# Patient Record
Sex: Female | Born: 1960 | Hispanic: No | Marital: Married | Smoking: Former smoker
Health system: Southern US, Community
[De-identification: ages and names within clinical notes are randomized; demographics above are authoritative.]

## PROBLEM LIST (undated history)

## (undated) DIAGNOSIS — F0390 Unspecified dementia without behavioral disturbance: Secondary | ICD-10-CM

## (undated) DIAGNOSIS — R569 Unspecified convulsions: Secondary | ICD-10-CM

## (undated) DIAGNOSIS — E0590 Thyrotoxicosis, unspecified without thyrotoxic crisis or storm: Secondary | ICD-10-CM

## (undated) DIAGNOSIS — R51 Headache: Secondary | ICD-10-CM

## (undated) DIAGNOSIS — S22000A Wedge compression fracture of unspecified thoracic vertebra, initial encounter for closed fracture: Secondary | ICD-10-CM

## (undated) DIAGNOSIS — R413 Other amnesia: Secondary | ICD-10-CM

## (undated) HISTORY — DX: Other amnesia: R41.3

## (undated) HISTORY — PX: OTHER SURGICAL HISTORY: SHX169

---

## 2008-07-06 ENCOUNTER — Other Ambulatory Visit: Admission: RE | Admit: 2008-07-06 | Discharge: 2008-07-06 | Payer: Self-pay | Admitting: Gynecology

## 2012-02-11 ENCOUNTER — Other Ambulatory Visit: Payer: Self-pay | Admitting: Family Medicine

## 2012-02-11 DIAGNOSIS — R413 Other amnesia: Secondary | ICD-10-CM

## 2012-02-13 ENCOUNTER — Ambulatory Visit
Admission: RE | Admit: 2012-02-13 | Discharge: 2012-02-13 | Disposition: A | Discharge status: Home / Self Care | Payer: BC Managed Care – PPO | Source: Ambulatory Visit | Attending: Family Medicine | Admitting: Family Medicine

## 2012-02-13 DIAGNOSIS — R413 Other amnesia: Secondary | ICD-10-CM

## 2012-03-10 ENCOUNTER — Other Ambulatory Visit: Payer: Self-pay | Admitting: Family Medicine

## 2012-03-10 ENCOUNTER — Other Ambulatory Visit (HOSPITAL_COMMUNITY)
Admission: RE | Admit: 2012-03-10 | Discharge: 2012-03-10 | Disposition: A | Discharge status: Home / Self Care | Payer: BC Managed Care – PPO | Source: Ambulatory Visit | Attending: Family Medicine | Admitting: Family Medicine

## 2012-03-10 DIAGNOSIS — Z1231 Encounter for screening mammogram for malignant neoplasm of breast: Secondary | ICD-10-CM

## 2012-03-10 DIAGNOSIS — Z01419 Encounter for gynecological examination (general) (routine) without abnormal findings: Secondary | ICD-10-CM | POA: Insufficient documentation

## 2012-03-15 ENCOUNTER — Ambulatory Visit: Payer: BC Managed Care – PPO

## 2012-04-16 ENCOUNTER — Ambulatory Visit: Payer: BC Managed Care – PPO

## 2013-03-17 ENCOUNTER — Ambulatory Visit (INDEPENDENT_AMBULATORY_CARE_PROVIDER_SITE_OTHER): Payer: BC Managed Care – PPO | Admitting: Neurology

## 2013-03-17 ENCOUNTER — Encounter: Payer: Self-pay | Admitting: Neurology

## 2013-03-17 VITALS — BP 115/76 | HR 68 | Ht 69.0 in | Wt 146.0 lb

## 2013-03-17 DIAGNOSIS — R413 Other amnesia: Secondary | ICD-10-CM

## 2013-03-17 NOTE — Patient Instructions (Addendum)
Lab evaluation today MRI brain

## 2013-03-17 NOTE — Progress Notes (Signed)
51 years old right-handed Caucasian female, accompanied by her husband, referred by her primary care physician Dr. Blair Heys for evaluation of memory loss.  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 CAT scan without contrast February 2013 reviewed, there was marked generalized atrophy, more prominent at bilateral frontal temporal,  Laboratory evaluation normal TSH, CBC, CMP, vitamin B12,  Review of system: She complains of fatigue,  PHYSICAL EXAMINATOINS:  Generalized: In no acute distress  Neck: Supple, no carotid bruits  Cardiac: Regular rate rhythm  Pulmonary: Clear to auscultation bilaterally  Musculoskeletal: No deformity  Neurological examination  Mentation: Tearful, frustrated, has difficulty following two-step commands, Mini-Mental Status Examination 12/30,  comprehensive difficulty, mild naming difficulty, normal repeating, she is not oriented to time, place, has difficulty doing serial 7, missed 2 out of 3 recalls, could not copy design, or  write a full sentence.  Cranial II-Xii: Pupils equal round reactive to light, extraoular movements were full, visual field were full on confrontational test. facial sensation and strength were normal. hearing was  intact to finger rubbing bilaterally. Uvula tongue midline.  head turning and shoulder shrug and were normal and symmetric.Tongue protrusion into cheek strength was normal.  Motor: normal tone, bulk and strength.  Sensory: Intact to fine touch, pinprick, preserved vibratory sensation, and proprioception at toes.  Coordination: Normal finger to nose, heel-to-shin bilaterally there was no truncal ataxia  Gait: Rising up from seated position without assistance, normal stance, without trunk ataxia, moderate stride, good arm swing, smooth turning, able to perform tiptoe, and heel walking without difficulty.   Romberg signs: Negative  Deep tendon reflexes: Brachioradialis 2/2, biceps 2/2, triceps 2/2, patellar 2/2, Achilles 2/2, plantar responses were flexor bilaterally.  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 other  2 complete evaluation with MRI of brain. 3 laboratory evaluation  4 return to clinic in 2-3 months.Marland Kitchen

## 2013-03-18 LAB — SEDIMENTATION RATE: Sed Rate: 2 mm/hr (ref 0–40)

## 2013-03-18 LAB — ANGIOTENSIN CONVERTING ENZYME: Angio Convert Enzyme: 19 U/L (ref 14–82)

## 2013-03-18 LAB — TSH: TSH: 1.11 u[IU]/mL (ref 0.450–4.500)

## 2013-03-18 LAB — HIV ANTIBODY (ROUTINE TESTING W REFLEX): HIV-1/HIV-2 Ab: NONREACTIVE

## 2013-03-18 LAB — T4, FREE: Free T4: 1.65 ng/dL (ref 0.82–1.77)

## 2013-03-18 LAB — ANA: Anti Nuclear Antibody(ANA): NEGATIVE

## 2013-03-18 LAB — RPR: RPR: NONREACTIVE

## 2013-03-18 LAB — FOLATE: Folate: 18.1 ng/mL (ref >3.0)

## 2013-03-22 ENCOUNTER — Telehealth: Payer: Self-pay | Admitting: Neurology

## 2013-03-22 NOTE — Telephone Encounter (Signed)
Cheyenne Tyler, please call patient for normal laboratory result

## 2013-03-23 ENCOUNTER — Other Ambulatory Visit: Payer: BC Managed Care – PPO

## 2013-03-23 NOTE — Telephone Encounter (Signed)
Husband called for lab results.  I spoke to husband and gave normal results, per Dr. Terrace Arabia.  He wasn't pleased that results were normal, he wants to know what the patient's problem is, and would like some answers.  He said she had an MRI on Wednesday,  I told him to wait for the results from MRI, before speaking to doctor.  He agreed.

## 2013-03-31 ENCOUNTER — Ambulatory Visit (INDEPENDENT_AMBULATORY_CARE_PROVIDER_SITE_OTHER): Payer: BC Managed Care – PPO

## 2013-03-31 DIAGNOSIS — R413 Other amnesia: Secondary | ICD-10-CM

## 2013-04-01 NOTE — Progress Notes (Signed)
Quick Note:  I have called her husband, abnormal lab showed thyroidglobulin ab, thyroid peroxidase antibody,  MRi showed atrophy, I advise her to come in April 7th 230pm. ______

## 2013-04-01 NOTE — Progress Notes (Signed)
Quick Note:  I have called her MRI brain findings atrophy, will review film on next follow up visit. ______

## 2013-04-04 ENCOUNTER — Encounter: Payer: Self-pay | Admitting: Neurology

## 2013-04-04 ENCOUNTER — Ambulatory Visit (INDEPENDENT_AMBULATORY_CARE_PROVIDER_SITE_OTHER): Payer: BC Managed Care – PPO | Admitting: Neurology

## 2013-04-04 VITALS — BP 110/75 | HR 68 | Wt 147.0 lb

## 2013-04-04 DIAGNOSIS — R413 Other amnesia: Secondary | ICD-10-CM

## 2013-04-04 MED ORDER — BUTALBITAL-APAP-CAFFEINE 50-325-40 MG PO TABS: 1.0000 | ORAL_TABLET | Freq: Four times a day (QID) | ORAL | Status: DC | PRN

## 2013-04-04 NOTE — Progress Notes (Signed)
52 years old right-handed Caucasian female, accompanied by her husband, referred by her primary care physician Dr. Blair Heys for evaluation of memory loss.  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  CAT scan without contrast February 2013 reviewed, there was marked generalized atrophy, more prominent at bilateral frontal temporal,  Laboratory evaluation normal TSH, CBC, CMP, vitamin B12,  Updated April 7th 2014:  She is with her husband today, laboratory tests revealed, there is evidence of markedly elevated thyroid globulin antibody, thyroid peroxidase antibody, otherwise negative HIV, RPR, B12, CMP, CBC,    MRI of the brain showed moderate to severe generalized atrophy,  PHYSICAL EXAMINATOINS:  Generalized: In no acute distress  Neck: Supple, no carotid bruits  Cardiac: Regular rate rhythm  Pulmonary: Clear to auscultation bilaterally  Musculoskeletal: No deformity  Neurological examination  Mentation: frustrated, has difficulty following two-step commands, difficult to perform Mini-Mental Status Examination,   Cranial II-Xii: Pupils equal round reactive to light, extraoular movements were full, visual field were full on confrontational test. facial sensation  and strength were normal. hearing was intact to finger rubbing bilaterally. Uvula tongue midline.  head turning and shoulder shrug and were normal and symmetric.Tongue protrusion into cheek strength was normal.  Motor: normal tone, bulk and strength.  Sensory: Intact to fine touch, pinprick, preserved vibratory sensation, and proprioception at toes.  Coordination: Normal finger to nose, heel-to-shin bilaterally there was no truncal ataxia  Gait: Rising up from seated position without assistance, normal stance, without trunk ataxia, moderate stride, good arm swing, smooth turning, able to perform tiptoe, and heel walking without difficulty.   Romberg signs: Negative  Deep tendon reflexes: Brachioradialis 2/2, biceps 2/2, triceps 2/2, patellar 2/2, Achilles 2/2, plantar responses were flexor bilaterally.  Assessment and plan: 52 years old Caucasian female, presenting with 5 years history of rapid onset memory trouble, mainly at short-term memory, also visuospatial disorientation,comprehensive difficulty,   1 MRI of the brain showed generalized atrophy. Indicating a slow process, 2. However the elevated thyroglobulin antibody, thyroid peroxidase antibody could be indicating a steroid responsive immunoencephalopathy such as Hashimoto's encephalopathy  3. EEG. 4. Repeat Labs,  5. Neuropsychiatric evaluation 6. Lumbar puncture.  7. Return to clinic in one month

## 2013-04-05 NOTE — Telephone Encounter (Signed)
Reviewed result during her follow up visit.

## 2013-04-06 LAB — HEAVY METALS SCREEN, URINE

## 2013-04-06 LAB — SJOGRENS SYNDROME-B EXTRACTABLE NUCLEAR ANTIBODY: ENA SSB (LA) Ab: <0.2 AI (ref 0.0–0.9)

## 2013-04-06 LAB — RHEUMATOID FACTOR: Rhuematoid fact SerPl-aCnc: 11.7 IU/mL (ref 0.0–13.9)

## 2013-04-06 LAB — THYROID PEROXIDASE ANTIBODY: Thyroid Peroxidase Ab: 548 IU/mL — ABNORMAL HIGH (ref 0–34)

## 2013-04-06 LAB — COPPER, SERUM: Copper: 83 ug/dL (ref 72–166)

## 2013-04-06 LAB — SJOGRENS SYNDROME-A EXTRACTABLE NUCLEAR ANTIBODY: ENA SSA (RO) Ab: <0.2 AI (ref 0.0–0.9)

## 2013-04-06 LAB — HEPATITIS PANEL, ACUTE: Hep A IgM: NEGATIVE

## 2013-04-06 LAB — THYROGLOBULIN ANTIBODY: Thyroglobulin Ab: 1.9 IU/mL — ABNORMAL HIGH (ref 0.0–0.9)

## 2013-04-06 NOTE — Progress Notes (Signed)
Quick Note:  Reviewed, will go over result with patient on follow up visit. ______

## 2013-04-07 ENCOUNTER — Ambulatory Visit (INDEPENDENT_AMBULATORY_CARE_PROVIDER_SITE_OTHER): Payer: BC Managed Care – PPO

## 2013-04-07 DIAGNOSIS — R413 Other amnesia: Secondary | ICD-10-CM

## 2013-04-11 ENCOUNTER — Other Ambulatory Visit: Payer: Self-pay | Admitting: Neurology

## 2013-04-11 DIAGNOSIS — R413 Other amnesia: Secondary | ICD-10-CM

## 2013-04-18 ENCOUNTER — Encounter: Payer: Self-pay | Admitting: Neurology

## 2013-04-18 NOTE — Procedures (Signed)
HISTORY: 52 years old female, with memory loss, progressively worsening  TECHNIQUE:  16 channel EEG was performed based on standard 10-16 international system. One channel was dedicated to EKG, which has demonstrates normal sinus rhythm of 72 beats per minutes.  Upon awakening, the posterior background activity was rhythmic with frequent small amplitude beta range activities, reactive to eye opening and closure. There was frequent muscle artifact, also intermittent posterior higher amplitude, theta range activity  There was no evidence of epilepsy for discharge.  Photic stimulation was performed, which induced a symmetric photic driving.  Hyperventilation was performed, there was no abnormality elicit.  No sleep was achieved.  CONCLUSION: This is an abnormal EEG.  There is evidence of increased beta activity, commonly seen in patient taking benzodiazepine, there is also evidence of intermittent theta activity, indicating mild bihemisphere dysfunction, commonly associated with metabolic toxic etiology

## 2013-04-20 ENCOUNTER — Ambulatory Visit
Admission: RE | Admit: 2013-04-20 | Discharge: 2013-04-20 | Disposition: A | Discharge status: Home / Self Care | Payer: BC Managed Care – PPO | Source: Ambulatory Visit | Attending: Neurology | Admitting: Neurology

## 2013-04-20 VITALS — BP 133/79 | HR 67

## 2013-04-20 DIAGNOSIS — R413 Other amnesia: Secondary | ICD-10-CM

## 2013-04-20 LAB — GRAM STAIN: Gram Stain: NONE SEEN

## 2013-04-22 LAB — CSF PANEL 1
Glucose, CSF: 60 mg/dL (ref 43–76)
Tube #: 4

## 2013-04-28 ENCOUNTER — Telehealth: Payer: Self-pay | Admitting: Neurology

## 2013-04-28 ENCOUNTER — Observation Stay (HOSPITAL_COMMUNITY)
Admission: EM | Admit: 2013-04-28 | Discharge: 2013-04-30 | Disposition: A | Discharge status: Home / Self Care | Payer: BC Managed Care – PPO | Attending: Internal Medicine | Admitting: Internal Medicine

## 2013-04-28 ENCOUNTER — Emergency Department (HOSPITAL_COMMUNITY): Payer: BC Managed Care – PPO

## 2013-04-28 ENCOUNTER — Encounter (HOSPITAL_COMMUNITY): Payer: Self-pay | Admitting: Emergency Medicine

## 2013-04-28 ENCOUNTER — Observation Stay (HOSPITAL_COMMUNITY): Payer: BC Managed Care – PPO

## 2013-04-28 DIAGNOSIS — E049 Nontoxic goiter, unspecified: Secondary | ICD-10-CM

## 2013-04-28 DIAGNOSIS — R404 Transient alteration of awareness: Secondary | ICD-10-CM | POA: Insufficient documentation

## 2013-04-28 DIAGNOSIS — R112 Nausea with vomiting, unspecified: Secondary | ICD-10-CM | POA: Insufficient documentation

## 2013-04-28 DIAGNOSIS — W19XXXA Unspecified fall, initial encounter: Secondary | ICD-10-CM | POA: Insufficient documentation

## 2013-04-28 DIAGNOSIS — X58XXXA Exposure to other specified factors, initial encounter: Secondary | ICD-10-CM | POA: Insufficient documentation

## 2013-04-28 DIAGNOSIS — F0789 Other personality and behavioral disorders due to known physiological condition: Secondary | ICD-10-CM | POA: Insufficient documentation

## 2013-04-28 DIAGNOSIS — F0280 Dementia in other diseases classified elsewhere without behavioral disturbance: Principal | ICD-10-CM | POA: Insufficient documentation

## 2013-04-28 DIAGNOSIS — Y92009 Unspecified place in unspecified non-institutional (private) residence as the place of occurrence of the external cause: Secondary | ICD-10-CM | POA: Insufficient documentation

## 2013-04-28 DIAGNOSIS — S22060A Wedge compression fracture of T7-T8 vertebra, initial encounter for closed fracture: Secondary | ICD-10-CM

## 2013-04-28 DIAGNOSIS — R569 Unspecified convulsions: Secondary | ICD-10-CM

## 2013-04-28 DIAGNOSIS — R413 Other amnesia: Secondary | ICD-10-CM | POA: Insufficient documentation

## 2013-04-28 DIAGNOSIS — S22009A Unspecified fracture of unspecified thoracic vertebra, initial encounter for closed fracture: Secondary | ICD-10-CM | POA: Insufficient documentation

## 2013-04-28 HISTORY — DX: Headache: R51

## 2013-04-28 HISTORY — DX: Thyrotoxicosis, unspecified without thyrotoxic crisis or storm: E05.90

## 2013-04-28 HISTORY — DX: Unspecified convulsions: R56.9

## 2013-04-28 HISTORY — DX: Unspecified dementia, unspecified severity, without behavioral disturbance, psychotic disturbance, mood disturbance, and anxiety: F03.90

## 2013-04-28 LAB — CBC WITH DIFFERENTIAL/PLATELET
Basophils Absolute: 0 10*3/uL (ref 0.0–0.1)
Basophils Relative: 0 % (ref 0–1)
Eosinophils Relative: 3 % (ref 0–5)
Lymphocytes Relative: 17 % (ref 12–46)
MCHC: 34.6 g/dL (ref 30.0–36.0)
MCV: 88.4 fL (ref 78.0–100.0)
Monocytes Absolute: 0.4 10*3/uL (ref 0.1–1.0)
Neutro Abs: 5.5 10*3/uL (ref 1.7–7.7)
Platelets: 198 10*3/uL (ref 150–400)
RDW: 13.2 % (ref 11.5–15.5)
WBC: 7.4 10*3/uL (ref 4.0–10.5)

## 2013-04-28 LAB — POCT I-STAT, CHEM 8
BUN: 20 mg/dL (ref 6–23)
Calcium, Ion: 1.3 mmol/L — ABNORMAL HIGH (ref 1.12–1.23)
Chloride: 107 mEq/L (ref 96–112)
Glucose, Bld: 145 mg/dL — ABNORMAL HIGH (ref 70–99)
HCT: 40 % (ref 36.0–46.0)
Potassium: 4 mEq/L (ref 3.5–5.1)

## 2013-04-28 LAB — COMPREHENSIVE METABOLIC PANEL
ALT: 14 U/L (ref 0–35)
AST: 19 U/L (ref 0–37)
Albumin: 3.7 g/dL (ref 3.5–5.2)
CO2: 28 mEq/L (ref 19–32)
Calcium: 9.6 mg/dL (ref 8.4–10.5)
Creatinine, Ser: 0.81 mg/dL (ref 0.50–1.10)
GFR calc non Af Amer: 82 mL/min — ABNORMAL LOW (ref >90)
Sodium: 141 mEq/L (ref 135–145)

## 2013-04-28 LAB — URINE MICROSCOPIC-ADD ON

## 2013-04-28 LAB — RAPID URINE DRUG SCREEN, HOSP PERFORMED
Amphetamines: NOT DETECTED
Barbiturates: NOT DETECTED
Benzodiazepines: NOT DETECTED
Cocaine: NOT DETECTED
Tetrahydrocannabinol: NOT DETECTED

## 2013-04-28 LAB — URINALYSIS, ROUTINE W REFLEX MICROSCOPIC
Bilirubin Urine: NEGATIVE
Glucose, UA: NEGATIVE mg/dL
Hgb urine dipstick: NEGATIVE
Ketones, ur: NEGATIVE mg/dL
Protein, ur: NEGATIVE mg/dL
pH: 5 (ref 5.0–8.0)

## 2013-04-28 LAB — TSH: TSH: 0.774 u[IU]/mL (ref 0.350–4.500)

## 2013-04-28 LAB — ETHANOL: Alcohol, Ethyl (B): <11 mg/dL (ref 0–11)

## 2013-04-28 MED ORDER — LORAZEPAM 2 MG/ML IJ SOLN
0.5000 mg | Freq: Once | INTRAMUSCULAR | Status: AC
  Administered 2013-04-28: 10:00:00 via INTRAVENOUS
  Filled 2013-04-28: qty 1

## 2013-04-28 MED ORDER — ALUM & MAG HYDROXIDE-SIMETH 200-200-20 MG/5ML PO SUSP: 30.0000 mL | ORAL | Status: DC | PRN

## 2013-04-28 MED ORDER — HYDROMORPHONE HCL PF 1 MG/ML IJ SOLN
1.0000 mg | Freq: Once | INTRAMUSCULAR | Status: AC
  Administered 2013-04-28: 1 mg via INTRAVENOUS
  Filled 2013-04-28: qty 1

## 2013-04-28 MED ORDER — OXYCODONE-ACETAMINOPHEN 5-325 MG PO TABS: ORAL_TABLET | ORAL | Status: DC

## 2013-04-28 MED ORDER — ONDANSETRON HCL 4 MG/2ML IJ SOLN
4.0000 mg | Freq: Once | INTRAMUSCULAR | Status: AC
  Administered 2013-04-28: 4 mg via INTRAVENOUS
  Filled 2013-04-28: qty 2

## 2013-04-28 MED ORDER — ACETAMINOPHEN 650 MG RE SUPP: 650.0000 mg | Freq: Four times a day (QID) | RECTAL | Status: DC | PRN

## 2013-04-28 MED ORDER — ONDANSETRON HCL 4 MG/2ML IJ SOLN
4.0000 mg | Freq: Four times a day (QID) | INTRAMUSCULAR | Status: DC | PRN
  Administered 2013-04-29: 4 mg via INTRAVENOUS
  Filled 2013-04-28: qty 2

## 2013-04-28 MED ORDER — LEVETIRACETAM 500 MG PO TABS: 500.0000 mg | ORAL_TABLET | Freq: Two times a day (BID) | ORAL | Status: DC

## 2013-04-28 MED ORDER — SODIUM CHLORIDE 0.9 % IV SOLN
INTRAVENOUS | Status: DC
  Administered 2013-04-28 (×2): via INTRAVENOUS
  Administered 2013-04-29: 100 mL/h via INTRAVENOUS
  Administered 2013-04-29: 14:00:00 via INTRAVENOUS

## 2013-04-28 MED ORDER — PANTOPRAZOLE SODIUM 40 MG IV SOLR
40.0000 mg | Freq: Two times a day (BID) | INTRAVENOUS | Status: DC
  Administered 2013-04-28 – 2013-04-29 (×3): 40 mg via INTRAVENOUS
  Filled 2013-04-28 (×5): qty 40

## 2013-04-28 MED ORDER — ACETAMINOPHEN 325 MG PO TABS: 650.0000 mg | ORAL_TABLET | Freq: Four times a day (QID) | ORAL | Status: DC | PRN

## 2013-04-28 MED ORDER — HYDROCODONE-ACETAMINOPHEN 5-325 MG PO TABS
1.0000 | ORAL_TABLET | ORAL | Status: DC | PRN
  Administered 2013-04-28 – 2013-04-29 (×3): 2 via ORAL
  Filled 2013-04-28 (×3): qty 2

## 2013-04-28 MED ORDER — ONDANSETRON HCL 4 MG/2ML IJ SOLN: 4.0000 mg | Freq: Three times a day (TID) | INTRAMUSCULAR | Status: AC | PRN

## 2013-04-28 MED ORDER — NICOTINE 21 MG/24HR TD PT24: 21.0000 mg | MEDICATED_PATCH | Freq: Every day | TRANSDERMAL | Status: DC | PRN

## 2013-04-28 MED ORDER — ACETAMINOPHEN 325 MG PO TABS: 650.0000 mg | ORAL_TABLET | ORAL | Status: DC | PRN

## 2013-04-28 MED ORDER — SODIUM POLYSTYRENE SULFONATE 15 GM/60ML PO SUSP: 30.0000 g | Freq: Once | ORAL | Status: DC

## 2013-04-28 MED ORDER — ZOLPIDEM TARTRATE 5 MG PO TABS: 5.0000 mg | ORAL_TABLET | Freq: Every evening | ORAL | Status: DC | PRN

## 2013-04-28 MED ORDER — IBUPROFEN 200 MG PO TABS: 600.0000 mg | ORAL_TABLET | Freq: Three times a day (TID) | ORAL | Status: DC | PRN

## 2013-04-28 MED ORDER — SODIUM CHLORIDE 0.9 % IV SOLN
500.0000 mg | Freq: Two times a day (BID) | INTRAVENOUS | Status: DC
  Administered 2013-04-28 – 2013-04-30 (×4): 500 mg via INTRAVENOUS
  Filled 2013-04-28 (×5): qty 5

## 2013-04-28 MED ORDER — ONDANSETRON HCL 8 MG PO TABS: 4.0000 mg | ORAL_TABLET | Freq: Three times a day (TID) | ORAL | Status: DC | PRN

## 2013-04-28 MED ORDER — MORPHINE SULFATE 4 MG/ML IJ SOLN
4.0000 mg | Freq: Once | INTRAMUSCULAR | Status: AC
  Administered 2013-04-28: 4 mg via INTRAVENOUS
  Filled 2013-04-28: qty 1

## 2013-04-28 MED ORDER — HYDROMORPHONE HCL PF 1 MG/ML IJ SOLN: 0.5000 mg | INTRAMUSCULAR | Status: DC | PRN

## 2013-04-28 MED ORDER — SODIUM CHLORIDE 0.9 % IJ SOLN: 3.0000 mL | Freq: Two times a day (BID) | INTRAMUSCULAR | Status: DC

## 2013-04-28 MED ORDER — LEVETIRACETAM 500 MG PO TABS
500.0000 mg | ORAL_TABLET | Freq: Once | ORAL | Status: AC
  Administered 2013-04-28: 500 mg via ORAL
  Filled 2013-04-28: qty 1

## 2013-04-28 NOTE — ED Notes (Signed)
Dr. Wentz at bedside. 

## 2013-04-28 NOTE — ED Notes (Signed)
PA at bedside.

## 2013-04-28 NOTE — H&P (Addendum)
[facility] History and Physical  Olanna Percifield ZOX:[id number] DOB: 16-Nov-1961 DOA: 04/28/2013  Referring physician: Dr Effie Shy PCP: Thora Lance, MD  Specialists: Dr Roseanne Reno.   Chief Complaint: seizure like activities.   HPI: Cheyenne Tyler is a 52 y.o. female with PMH significant for memory and cognitive problems worse over last year, she started to see Dr Threasa Beards for evaluation of the same. Patient was at baseline when the morning of admission while she was in the kitchen reaching for coffee she fell backward. Husband witness fall. Patient was lying on the floor on fetal position, she was drooling and having sucking motion of her mouth.  She was not responsive for 10 minutes. After she regain consciousness she was confused. Patient only recall that she was going to get coffee. She denies chest pain or palpitation prior to episode. No history of fever, dysuria, or cough. She is having back pain, started after the fall.   Of notes her work up for memory problems include TSH, normal, HIV test negative, LP negative for bacterial  Infection, RF negative. She had positive thyroglobulin AB and thyroid peroxidase antibody.   Review of Systems: Negative except as per HPI.   Past Medical History  Diagnosis Date  . Memory loss    Past Surgical History  Procedure Laterality Date  . Uterine ablation     Social History:  reports that she has been smoking Cigarettes.  She has been smoking about 0.50 packs per day. She has never used smokeless tobacco. She reports that she does not drink alcohol or use illicit drugs. She is not working. She has 2 children.    No Known Allergies  Family History  Problem Relation Age of Onset  . Memory loss Mother   . Stroke Mother     Prior to Admission medications   Medication Sig Start Date End Date Taking? Authorizing Provider  butalbital-acetaminophen-caffeine (FIORICET, ESGIC) 50-325-40 MG per tablet Take 1 tablet by mouth every 6 (six) hours as needed for  pain or headache. 04/04/13  Yes Levert Feinstein, MD  fish oil-omega-3 fatty acids 1000 MG capsule Take 2 g by mouth 3 (three) times daily.   Yes Historical Provider, MD  GINKGO BILOBA PO Take 1 capsule by mouth 3 (three) times daily.    Yes Historical Provider, MD  ibuprofen (ADVIL,MOTRIN) 200 MG tablet Take 200 mg by mouth every 6 (six) hours as needed for pain.   Yes Historical Provider, MD  Vitamin Mixture (VITAMIN E COMPLETE PO) Take 1 tablet by mouth 3 (three) times daily.    Yes Historical Provider, MD  levETIRAcetam (KEPPRA) 500 MG tablet Take 1 tablet (500 mg total) by mouth 2 (two) times daily. 04/28/13   Nicole Pisciotta, PA-C  oxyCODONE-acetaminophen (PERCOCET/ROXICET) 5-325 MG per tablet 1 to 2 tabs PO q6hrs  PRN for pain 04/28/13   Wynetta Emery, PA-C   Physical Exam: Filed Vitals:   04/28/13 1417 04/28/13 1500 04/28/13 1600 04/28/13 1700  BP: 101/69 98/66 98/67  103/79  Pulse: 72 57 62 71  Temp:      TempSrc:      Resp: 16 11 13 19   SpO2: 98% 96% 96% 96%    General Appearance:    Flat affect, Alert, cooperative, no distress, appears stated age  Head:    Normocephalic, without obvious abnormality, atraumatic  Eyes:    PERRL, conjunctiva/corneas clear, EOM's intact,      Ears:    Normal TM's and external ear canals, both ears  Nose:  Nares normal, septum midline, mucosa normal, no drainage    or sinus tenderness  Throat:   Lips, mucosa,  normal; teeth and gums normal, marks of tongue bites on the right side.   Neck:   Supple, symmetrical, trachea midline, no adenopathy;    ; no carotid   bruit or JVD     Lungs:     Clear to auscultation bilaterally, respirations unlabored  Chest Wall:    No tenderness or deformity   Heart:    Regular rate and rhythm, S1 and S2 normal, no murmur, rub   or gallop     Abdomen:     Soft, non-tender, bowel sounds active all four quadrants,    no masses, no organomegaly        Extremities:   Extremities normal, atraumatic, no cyanosis or edema   Pulses:   2+ and symmetric all extremities  Skin:   Skin color, texture, turgor normal, no rashes or lesions  Lymph nodes:   Cervical, supraclavicular, and axillary nodes normal  Neurologic:   CNII-XII intact, normal strength, sensation and reflexes    Throughout, she has problems following commands, speech normal.     Labs on Admission:  Basic Metabolic Panel:  Recent Labs Lab 04/28/13 0829 04/28/13 0905  NA 141 140  K 4.0 4.0  CL 106 107  CO2 28  --   GLUCOSE 146* 145*  BUN 20 20  CREATININE 0.81 0.80  CALCIUM 9.6  --    Liver Function Tests:  Recent Labs Lab 04/28/13 0829  AST 19  ALT 14  ALKPHOS 52  BILITOT 0.5  PROT 6.6  ALBUMIN 3.7   CBC:  Recent Labs Lab 04/28/13 0829 04/28/13 0905  WBC 7.4  --   NEUTROABS 5.5  --   HGB 13.4 13.6  HCT 38.7 40.0  MCV 88.4  --   PLT 198  --    Cardiac Enzymes: No results found for this basename: CKTOTAL, CKMB, CKMBINDEX, TROPONINI,  in the last 168 hours  BNP (last 3 results) No results found for this basename: PROBNP,  in the last 8760 hours CBG:  Recent Labs Lab 04/28/13 0835  GLUCAP 160*    Radiological Exams on Admission: Dg Chest 2 View  04/28/2013  *RADIOLOGY REPORT*  Clinical Data: Fall  CHEST - 2 VIEW  Comparison: None.  Findings: Cardiomediastinal silhouette is unremarkable.  No acute infiltrate or pulmonary edema.  No diagnostic pneumothorax. Moderate compression deformity mid thoracic spine of indeterminate age.  Clinical correlation is necessary.  IMPRESSION: No acute infiltrate or pulmonary edema.  No pneumothorax.  Moderate compression deformity mid thoracic spine of indeterminate age. Clinical correlation is necessary.   Original Report Authenticated By: Natasha Mead, M.D.    Dg Thoracic Spine W/swimmers  04/28/2013  *RADIOLOGY REPORT*  Clinical Data: Pain, fall, seizure  THORACIC SPINE - 2 VIEW + SWIMMERS  Comparison: None.  Findings: Three views of thoracic spine submitted.  Alignment and disc  spaces are preserved.  There is moderate compression deformity mid thoracic spine of indeterminate age.  Clinical correlation is necessary.  If recent fracture is suspected further evaluation with CT scan or MRI is recommended.  IMPRESSION: Moderate compression deformity mid thoracic spine of indeterminate age.  Clinical correlation is necessary.   Original Report Authenticated By: Natasha Mead, M.D.    Dg Lumbar Spine Complete  04/28/2013  *RADIOLOGY REPORT*  Clinical Data: Loss of consciousness  LUMBAR SPINE - COMPLETE 4+ VIEW  Comparison: None.  Findings: Five  views of the lumbar spine submitted.  No acute fracture or subluxation.  Alignment, disc spaces and vertebral height are preserved.  IMPRESSION: No acute fracture or subluxation.   Original Report Authenticated By: Natasha Mead, M.D.    Ct Head Wo Contrast  04/28/2013  *RADIOLOGY REPORT*  Clinical Data: Loss of consciousness and trauma; question seizure  CT HEAD WITHOUT CONTRAST  Technique:  Contiguous axial images were obtained from the base of the skull through the vertex without contrast.  Comparison: February 13, 2012  Findings: There is mild diffuse atrophy which is stable.  There is no mass, hemorrhage, extra-axial fluid collection, or midline shift.  Minimal periventricular small vessel disease in the centra semiovale bilaterally is stable.  There is no acute infarct apparent.  There is no new gray-white compartment lesions.  Bony calvarium appears intact.  The mastoid air cells are clear.  IMPRESSION: Mild diffuse atrophy with minimal small vessel disease, stable.  No mass, hemorrhage, or acute appearing infarct.   Original Report Authenticated By: Bretta Bang, M.D.    Ct Cervical Spine Wo Contrast  04/28/2013  *RADIOLOGY REPORT*  Clinical Data: Pain post trauma  CT CERVICAL SPINE WITHOUT CONTRAST  Technique:  Multidetector CT imaging of the cervical spine was performed. Multiplanar CT image reconstructions were also generated.  Comparison:  None.  Findings: There is no fracture or spondylolisthesis.  Prevertebral soft tissues and predental space regions are normal.  There is mild disc space narrowing at C5-6 and C6-7.  There is mild facet hypertrophy at multiple levels bilaterally.  No disc extrusion or stenosis.  There is diffuse inhomogeneity to the appearance of the thyroid.  IMPRESSION: Mild osteoarthritic change.  No fracture or spondylolisthesis.  Diffusely abnormal appearance of the thyroid.  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  This finding may warrant correlation with thyroid ultrasound to further evaluate.   Original Report Authenticated By: Bretta Bang, M.D.    Ct Thoracic Spine Wo Contrast  04/28/2013  *RADIOLOGY REPORT*  Clinical Data: Pain.  Abnormal plain films.  CT THORACIC SPINE WITHOUT CONTRAST  Technique:  Multidetector CT imaging of the thoracic spine was performed without intravenous contrast administration. Multiplanar CT image reconstructions were also generated  Comparison: Plain films thoracic spine earlier this same date.  Findings: As seen on plain films, there is a biconcave compression fracture deformity of T7.  Vertebral body height loss centrally is estimated at 70%.  The fracture appears acute or subacute with sharp fracture margins.  Minimal bony retropulsion is seen at the level of the mid vertebral body.  There is no extension into the posterior elements.  No other fracture is identified. The central canal and foramina appear open at all levels.  Convex right scoliosis is noted.  Tiny Schmorl's node in the superior endplate of T11 is identified.  Paraspinous structures demonstrate a diffusely heterogeneous thyroid gland.  Imaged lung parenchyma shows only dependent atelectatic change.  IMPRESSION:  1.  Acute/subacute biconcave compression fracture deformity of T7 with approximate 70% vertebral body height loss.  No extension into the posterior elements or malalignment is identified.  No acquired central canal  stenosis is present. 2.  Diffusely heterogeneous thyroid gland.  Ultrasound could be used for further evaluation.   Original Report Authenticated By: Holley Dexter, M.D.     EKG: Independently reviewed. Sinus rhythm.   Assessment/Plan Active Problems:   Memory loss   Seizure   Nausea & vomiting   1-Seizure: First episode. CT head stable. Continue with Keppra. Neurology consulted.  2-Cognitive impairment,  memory problems: CT head stable. Repeat TSH. Patient follow with DR Threasa Beards, neurologist. Patient had LP , CSF fluid was negative for infection. Neurology consulted.  3-Nausea, Vomiting: started in the ED. Patient denies abdominal pain. Abdominal exam benign. IV fluids. Will start with KUB. Start protonix. NPO. Check lipase, lactic acid.  4-T 7 compression fracture: I ask ED PA to consult neurosurgery. Pain medication.  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 She will need to follow up with endocrinologist for abnormal thyroids antibodies.   Family Communication: Care discussed with husband who was at bedside.  Disposition Plan: admit for observation, this might change depending on test results.   Time spent: 75 minutes.   Marynell Bies [facility] Pager [phone] If 7PM-7AM, please contact night-coverage [email or web address] Password [facility] 04/28/2013, 5:33 PM

## 2013-04-28 NOTE — ED Notes (Addendum)
Pt returned from radiology complaining of back pain. Pt to be medicated. Medication request sent to pharmacy for Keppra.

## 2013-04-28 NOTE — Consult Note (Signed)
Reason for Consult: Seizure Referring Physician: Regalado, B  CC: Seizure, altered mental status  History is obtained from: Husband  HPI: Cheyenne Tyler is a 52 y.o. female who for the past 2 years has been having problems with progressive difficulty thinking. 2 years ago, she had progressive difficulty with speech, and had a CT at that time though this subsequently improved and therefore they did not pursue further workup at that time.  For the past year she has been getting progressively more confused, though this seems to come in waves. Her husband describes that she can have an acute worsening that last for a few hours and then she is back to her normal self.   She does have staring spells which can last for a few minutes at a time, followed by confusion.  Today, she had an episode where she fell suddenly to the ground, stiffened up, and had smacking movements of her lips. She is unresponsive with slow deliberate respirations for 5 or 10 minutes, and has subsequently been steadily improving but remains confused.   ROS: Unable to obtain due to altered mental  Past Medical History  Diagnosis Date  . Memory loss     Family History: Mother-dementia, started at age 76 and progressed until her death at age 39  Social History: Tob: Denies  Exam: Current vital signs: BP 107/66  Pulse 66  Temp(Src) 98.2 F (36.8 C) (Oral)  Resp 16  Wt 65.5 kg (144 lb 6.4 oz)  BMI 21.31 kg/m2  SpO2 99% Vital signs in last 24 hours: Temp:  [97.5 F (36.4 C)-98.2 F (36.8 C)] 98.2 F (36.8 C) (05/01 1820) Pulse Rate:  [56-106] 66 (05/01 1820) Resp:  [11-22] 16 (05/01 1820) BP: (98-110)/(63-87) 107/66 mmHg (05/01 1820) SpO2:  [94 %-100 %] 99 % (05/01 1820) Weight:  [65.5 kg (144 lb 6.4 oz)] 65.5 kg (144 lb 6.4 oz) (05/01 1820)  General: In bed, no apparent distress CV: Regular rate and rhythm Mental Status: Patient is awake, alert, oriented to person, but not month, or year. She is unable  to give me the number of quarters in a dollar. She simply states I don't know. She is able to name simple objects. She is able to follow commands, though with more complex commands she has difficulty. For example when asking her to do finger-nose-finger she touches her nose and touch my finger a single time but on repeated episodes tries to touch my nose. She is unable to interpret proverbs  Cranial Nerves: II: Visual Fields are full. Pupils are equal, round, and reactive to light.  Discs are without papilledema. III,IV, VI: EOMI without ptosis or diploplia.  V: Facial sensation is symmetric to temperature VII: Facial movement is symmetric.  VIII: hearing is intact to voice X: Uvula elevates symmetrically XI: Shoulder shrug is symmetric. XII: tongue is midline without atrophy or fasciculations.  Motor: Tone is normal. Bulk is normal. 5/5 strength was present in all four extremities.  Sensory: Sensation is symmetric to light touch and temperature in the arms and legs. Deep Tendon Reflexes: 3+ and symmetric in the biceps and patellae.  Plantars: Toes are downgoing bilaterally.  Cerebellar: FNF without ataxia, but seem mental status for description of her compliance. Gait: Not tested due to patient safety concern  I have reviewed labs in epic and the results pertinent to this consultation are: She has had an extensive workup for her dementia by Dr. Terrace Arabia. This has been negative with the exception of thyroid autoantibodies.  I have reviewed the images obtained: CT head-atrophy most prominent in the frontotemporal areas. I feel that the atrophy may be slightly worse than the previous CT though the read states that it is stable.  Impression: 52 year old female with progressive memory loss and atrophy on CT. Her workup has been extensive, and thus far has revealed only elevated thyroid antibodies. 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  Her husband is scheduling an appointment at [facility] for further evaluation with a cognitive specialist.  Recommendations: 1) agree with Keppra 500 mg twice a day can change to by mouth when patient is able to take it. 2) EEG in the a.m. 3) One lab panel for consideration I do not see having been sent would be a paraneoplastic panel, specifically one that includes voltage-gated potassium channel antibodies. It is possible that this has been sent by her outpatient physician, and given the cost of it, would check prior to sending it.  4) Could consider steroids, would start with prednisone 1 mg/kg. if her followup with cognitive specialist is not to be delayed, then it may be reasonable to hold off on steroids until that time. 5) If steroids are pursued, would start GI prophylaxis at the same time   Ritta Slot, MD [facility] [phone]  If 7pm- 7am, please page neurology on call at [phone].

## 2013-04-28 NOTE — Telephone Encounter (Signed)
I have left message :

## 2013-04-28 NOTE — ED Notes (Signed)
Pt complains of headache and left flank pain from fall.

## 2013-04-28 NOTE — ED Notes (Signed)
Spouse: [phone]

## 2013-04-28 NOTE — Telephone Encounter (Signed)
I spoke to patient's husband, and the patient had a seizure this morning, and was unconscience for 5-10 minutes.  They are at the ER now, and the husband wanted Dr. Terrace Arabia to be aware.  He is also requesting the results of patient's EEG and LP.  He is also requesting that Dr. Terrace Arabia compare any CT done at the hospital with the one done a few months ago to see progression of the disease.  He can be reached at [phone].

## 2013-04-28 NOTE — ED Notes (Signed)
Seizure pads placed on patients bed. 

## 2013-04-28 NOTE — ED Notes (Signed)
Patient requested in and out cath.  Sterile technique done thru-out procedure.  Patient responded well.

## 2013-04-28 NOTE — Progress Notes (Signed)
Pt received from ed , initiail assessment done made comfortable in bed, Iv started pt NSR on cardiac monitor    Confused of place and month of the year  and reoriented. On bed rest .. Pt denied any c/o at this time. Call  Light within reach and exit bed alarm on . Abdominal xray done at bedside. Azzie Roup RN

## 2013-04-28 NOTE — ED Provider Notes (Signed)
Cheyenne Tyler is a 52 y.o. female who was home with her husband today, when she suddenly collapse falling on her back, then began exhibiting clonic-type behavior of her upper extremities bilaterally. There is also oral sucking motions. The seizure lasted about 10 minutes, then resolved with a postictal state. She has been followed by a neurologist for rapidly progressive confusion, and recently had MR and EEG. EEG did not show a specific seizure focus.   Exam- patient is alert, but noncommunicative. She is resting comfortably. She opens and closes her eyes at times. There has been no seizure activity in the emergency department.  As I was examining the patient, the patient's husband said that she was having a seizure. At that time. There was some furrowing of her forehead. There is no rhythmic contractions of her extremities.  Assessment: new-onset seizure disorder, with fall and T-spine fracture. No indication for thoracic myelopathy. Patient has significant pain that has been difficult to manage and caused her to be sleepy after treatment in the emergency department. She was previously very functional at home despite having a recent diagnosis of frontal temporal dementia.    Medical screening examination/treatment/procedure(s) were conducted as a shared visit with non-physician practitioner(s) and myself.  I personally evaluated the patient during the encounter  Flint Melter, MD 04/29/13 [phone]

## 2013-04-28 NOTE — ED Notes (Addendum)
Pt actively vomiting at bedside with admitting doctor.  Green color emesis noted in emesis basin.

## 2013-04-28 NOTE — ED Provider Notes (Signed)
History     CSN: [id number]  Arrival date & time 04/28/13  4098   First MD Initiated Contact with Patient 04/28/13 [phone]      Chief Complaint  Patient presents with  . Loss of Consciousness    (Consider location/radiation/quality/duration/timing/severity/associated sxs/prior treatment) HPI  Cheyenne Tyler is a 52 y.o. female in by EMS for syncope and seizure like activity. Patient has past medical history significant for dementia and moderate to severe brain atrophy. Patient was in the kitchen this a.m. with her husband she went to reach for her coffee and fell to the ground hitting her low back and occipital area she then had 5-10 minutes of convulsions in the upper extremities and heavy breathing with sucking motions in the mouth. There was no loss of bowel or bladder control. She has no prior history of seizure like activity. Patient denies any prodrome however level V caveat applies that she is demented and requires constant care.  Past Medical History  Diagnosis Date  . Memory loss     Past Surgical History  Procedure Laterality Date  . Uterine ablation      Family History  Problem Relation Age of Onset  . Memory loss Mother   . Stroke Mother     History  Substance Use Topics  . Smoking status: Current Every Day Smoker -- 0.50 packs/day    Types: Cigarettes  . Smokeless tobacco: Never Used  . Alcohol Use: No    OB History   Grav Para Term Preterm Abortions TAB SAB Ect Mult Living                  Review of Systems  Constitutional: Negative for fever.  Respiratory: Negative for shortness of breath.   Cardiovascular: Negative for chest pain.  Gastrointestinal: Negative for nausea, vomiting, abdominal pain and diarrhea.  Musculoskeletal: Positive for back pain.  Neurological: Positive for seizures.  All other systems reviewed and are negative.    Allergies  Review of patient's allergies indicates no known allergies.  Home Medications   Current Outpatient  Rx  Name  Route  Sig  Dispense  Refill  . butalbital-acetaminophen-caffeine (FIORICET, ESGIC) 50-325-40 MG per tablet   Oral   Take 1 tablet by mouth every 6 (six) hours as needed for headache.   30 tablet   3   . fish oil-omega-3 fatty acids 1000 MG capsule   Oral   Take 2 g by mouth 3 (three) times daily.         Marland Kitchen GINKGO BILOBA PO   Oral   Take by mouth 3 (three) times daily.         Marland Kitchen ibuprofen (ADVIL,MOTRIN) 200 MG tablet   Oral   Take 200 mg by mouth every 6 (six) hours as needed for pain.         . Vitamin Mixture (VITAMIN E COMPLETE PO)   Oral   Take by mouth 3 (three) times daily.           BP 108/69  Pulse 82  Temp(Src) 97.5 F (36.4 C) (Oral)  Resp 18  SpO2 96%  Physical Exam  Nursing note and vitals reviewed. Constitutional: She appears well-developed and well-nourished. No distress.  HENT:  Head: Normocephalic and atraumatic.  Mouth/Throat: Oropharynx is clear and moist.  No battle sign or hemotympanums,  Patient has bite mark to right tongue  Eyes: Conjunctivae and EOM are normal. Pupils are equal, round, and reactive to light.  Neck: Normal range of  motion.  C-spine collar in place.  Cardiovascular: Normal rate, regular rhythm and intact distal pulses.   Pulmonary/Chest: Effort normal and breath sounds normal. No stridor. No respiratory distress. She has no wheezes. She has no rales. She exhibits tenderness.  Abdominal: Soft. Bowel sounds are normal. She exhibits no distension and no mass. There is tenderness. There is no rebound and no guarding.  Musculoskeletal: Normal range of motion.  Neurological: She is alert.  Oriented to person place but not time.   Patient follows commands, Pupils are equal round and reactive to light, no facial asymmetry or slurring, uvula and tongue midline, strength is 5 out of 5x4 extremities, distal sensation is grossly intact.   Skin: Skin is warm.  Psychiatric: She has a normal mood and affect.    ED  Course  Procedures (including critical care time)  Labs Reviewed  COMPREHENSIVE METABOLIC PANEL - Abnormal; Notable for the following:    Glucose, Bld 146 (*)    GFR calc non Af Amer 82 (*)    All other components within normal limits  URINALYSIS, ROUTINE W REFLEX MICROSCOPIC - Abnormal; Notable for the following:    APPearance CLOUDY (*)    Leukocytes, UA SMALL (*)    All other components within normal limits  GLUCOSE, CAPILLARY - Abnormal; Notable for the following:    Glucose-Capillary 160 (*)    All other components within normal limits  URINE MICROSCOPIC-ADD ON - Abnormal; Notable for the following:    Casts HYALINE CASTS (*)    All other components within normal limits  POCT I-STAT, CHEM 8 - Abnormal; Notable for the following:    Glucose, Bld 145 (*)    Calcium, Ion 1.30 (*)    All other components within normal limits  URINE CULTURE  CBC WITH DIFFERENTIAL  URINE RAPID DRUG SCREEN (HOSP PERFORMED)  ETHANOL  TRICYCLICS SCREEN, URINE  PREGNANCY, URINE  TSH  LIPASE, BLOOD  LACTIC ACID, PLASMA  TROPONIN I  MAGNESIUM   Dg Chest 2 View  04/28/2013  *RADIOLOGY REPORT*  Clinical Data: Fall  CHEST - 2 VIEW  Comparison: None.  Findings: Cardiomediastinal silhouette is unremarkable.  No acute infiltrate or pulmonary edema.  No diagnostic pneumothorax. Moderate compression deformity mid thoracic spine of indeterminate age.  Clinical correlation is necessary.  IMPRESSION: No acute infiltrate or pulmonary edema.  No pneumothorax.  Moderate compression deformity mid thoracic spine of indeterminate age. Clinical correlation is necessary.   Original Report Authenticated By: Natasha Mead, M.D.    Dg Thoracic Spine W/swimmers  04/28/2013  *RADIOLOGY REPORT*  Clinical Data: Pain, fall, seizure  THORACIC SPINE - 2 VIEW + SWIMMERS  Comparison: None.  Findings: Three views of thoracic spine submitted.  Alignment and disc spaces are preserved.  There is moderate compression deformity mid thoracic  spine of indeterminate age.  Clinical correlation is necessary.  If recent fracture is suspected further evaluation with CT scan or MRI is recommended.  IMPRESSION: Moderate compression deformity mid thoracic spine of indeterminate age.  Clinical correlation is necessary.   Original Report Authenticated By: Natasha Mead, M.D.    Dg Lumbar Spine Complete  04/28/2013  *RADIOLOGY REPORT*  Clinical Data: Loss of consciousness  LUMBAR SPINE - COMPLETE 4+ VIEW  Comparison: None.  Findings: Five views of the lumbar spine submitted.  No acute fracture or subluxation.  Alignment, disc spaces and vertebral height are preserved.  IMPRESSION: No acute fracture or subluxation.   Original Report Authenticated By: Natasha Mead, M.D.  Ct Head Wo Contrast  04/28/2013  *RADIOLOGY REPORT*  Clinical Data: Loss of consciousness and trauma; question seizure  CT HEAD WITHOUT CONTRAST  Technique:  Contiguous axial images were obtained from the base of the skull through the vertex without contrast.  Comparison: February 13, 2012  Findings: There is mild diffuse atrophy which is stable.  There is no mass, hemorrhage, extra-axial fluid collection, or midline shift.  Minimal periventricular small vessel disease in the centra semiovale bilaterally is stable.  There is no acute infarct apparent.  There is no new gray-white compartment lesions.  Bony calvarium appears intact.  The mastoid air cells are clear.  IMPRESSION: Mild diffuse atrophy with minimal small vessel disease, stable.  No mass, hemorrhage, or acute appearing infarct.   Original Report Authenticated By: Bretta Bang, M.D.    Ct Cervical Spine Wo Contrast  04/28/2013  *RADIOLOGY REPORT*  Clinical Data: Pain post trauma  CT CERVICAL SPINE WITHOUT CONTRAST  Technique:  Multidetector CT imaging of the cervical spine was performed. Multiplanar CT image reconstructions were also generated.  Comparison: None.  Findings: There is no fracture or spondylolisthesis.  Prevertebral soft  tissues and predental space regions are normal.  There is mild disc space narrowing at C5-6 and C6-7.  There is mild facet hypertrophy at multiple levels bilaterally.  No disc extrusion or stenosis.  There is diffuse inhomogeneity to the appearance of the thyroid.  IMPRESSION: Mild osteoarthritic change.  No fracture or spondylolisthesis.  Diffusely abnormal appearance of the thyroid.  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  This finding may warrant correlation with thyroid ultrasound to further evaluate.   Original Report Authenticated By: Bretta Bang, M.D.    Ct Thoracic Spine Wo Contrast  04/28/2013  *RADIOLOGY REPORT*  Clinical Data: Pain.  Abnormal plain films.  CT THORACIC SPINE WITHOUT CONTRAST  Technique:  Multidetector CT imaging of the thoracic spine was performed without intravenous contrast administration. Multiplanar CT image reconstructions were also generated  Comparison: Plain films thoracic spine earlier this same date.  Findings: As seen on plain films, there is a biconcave compression fracture deformity of T7.  Vertebral body height loss centrally is estimated at 70%.  The fracture appears acute or subacute with sharp fracture margins.  Minimal bony retropulsion is seen at the level of the mid vertebral body.  There is no extension into the posterior elements.  No other fracture is identified. The central canal and foramina appear open at all levels.  Convex right scoliosis is noted.  Tiny Schmorl's node in the superior endplate of T11 is identified.  Paraspinous structures demonstrate a diffusely heterogeneous thyroid gland.  Imaged lung parenchyma shows only dependent atelectatic change.  IMPRESSION:  1.  Acute/subacute biconcave compression fracture deformity of T7 with approximate 70% vertebral body height loss.  No extension into the posterior elements or malalignment is identified.  No acquired central canal stenosis is present. 2.  Diffusely heterogeneous thyroid gland.  Ultrasound could be  used for further evaluation.   Original Report Authenticated By: Holley Dexter, M.D.     Date: 04/28/2013  Rate: 80  Rhythm: normal sinus rhythm  QRS Axis: normal  Intervals: normal  ST/T Wave abnormalities: normal  Conduction Disutrbances:none  Narrative Interpretation:   Old EKG Reviewed: unchanged   1. New onset seizure   2. Wedge compression fracture of T7 vertebra, closed, initial encounter   3. Enlarged thyroid gland       MDM   Sanna Porcaro is a 52 y.o. female with frontal lobe atrophy  dementia and new onset seizure. Patient is mentating at her baseline as per husband is her caregiver. Recent MRI shows mild increase in cortical atrophy consistent with microvascular ischemia. Recent LP shows no CSF abnormalities, EEG interpretation: "increased beta  activity, commonly seen in patient taking benzodiazepine, there  is also evidence of intermittent theta activity, indicating mild  bihemisphere dysfunction, commonly associated with metabolic  toxic etiology"  Head CT and C-spine CT are negative, rigid collar removed. Blood work is unremarkable and urinalysis and urine drug screen also show no abnormalities. Patient is having difficulty getting herself into a sitting position on the gurney, chest, lumbar and thoracic plain films ordered.  Neurology consult from Dr. Terrace Arabia appreciated: She will expedite an appointment in for the patient and recommends starting Keppra at 500 mg twice a day.   CT of the thoracic spine shows a T7 compression fracture with 70% height loss fracture is stable and there is no compression of the spinal cord.   Patient will be admitted to a telemetry bed by [facility] hospitalist Dr. Othella Boyer, neurosurgery will be consult at her request.   Neurosurgery consult from Dr. Phoebe Perch appreciated: He recommends TLSO brace saying anytime the patient is out of bed and outpatient followup with him for osteoporosis evaluation. He will be available to evaluate the patient  personally if requested by the hospitalist.   This is a shared visit with the attending physician who personally evaluated the patient and agrees with the care plan.    Filed Vitals:   04/28/13 1417 04/28/13 1500 04/28/13 1600 04/28/13 1700  BP: 101/69 98/66 98/67  103/79  Pulse: 72 57 62 71  Temp:      TempSrc:      Resp: 16 11 13 19   SpO2: 98% 96% 96% 96%            Wynetta Emery, PA-C 04/28/13 1738

## 2013-04-28 NOTE — ED Notes (Signed)
C-collar removed by PA

## 2013-04-28 NOTE — ED Notes (Signed)
This RN into the room to speak with family and patient about discharge.  Spouse reports "I don't feel comfortable with her going home. I told the PA that."  Pt started vomiting small amounts of yellow emesis.  Spouse informed to wait approx. 15 minutes before drinking PO fluids.  MD to be involved in patient.

## 2013-04-28 NOTE — ED Notes (Signed)
Standing at kitchen counter, reached for coffee and as she went for coffee she fell backwards hitting head on cabinet. Spouse witnessed seizure-like activity with body involvement that lasted approx 5-10 mins.  Drooling. Sucking noises.  Pt had MRI done April 3, spinal tap last week.  Pt sees Teacher, music.

## 2013-04-28 NOTE — ED Notes (Signed)
Pt brought back from XR with report of n/v in radiology.

## 2013-04-28 NOTE — ED Notes (Signed)
Pt remains in radiology 

## 2013-04-28 NOTE — ED Notes (Signed)
Will hold on pain medicine until pts pain returns.

## 2013-04-28 NOTE — ED Notes (Signed)
Attempted to call report x2.  Informed that room is not ready for pt.  Consulting civil engineer notified.

## 2013-04-28 NOTE — ED Notes (Signed)
Pt to CT

## 2013-04-28 NOTE — ED Notes (Signed)
Pt sleeping. 

## 2013-04-29 ENCOUNTER — Inpatient Hospital Stay (HOSPITAL_COMMUNITY)
Admit: 2013-04-29 | Discharge: 2013-04-29 | Disposition: A | Discharge status: Home / Self Care | Payer: BC Managed Care – PPO | Attending: Neurology | Admitting: Neurology

## 2013-04-29 ENCOUNTER — Encounter (HOSPITAL_COMMUNITY): Payer: Self-pay | Admitting: *Deleted

## 2013-04-29 ENCOUNTER — Observation Stay (HOSPITAL_COMMUNITY): Payer: BC Managed Care – PPO

## 2013-04-29 LAB — URINE CULTURE: Colony Count: NO GROWTH

## 2013-04-29 LAB — TROPONIN I: Troponin I: <0.3 ng/mL (ref <0.30)

## 2013-04-29 NOTE — Evaluation (Signed)
Physical Therapy Evaluation Patient Details Name: Cheyenne Tyler MRN: [id number] DOB: February 06, 1961 Today's Date: 04/29/2013 Time: 7829-5621 PT Time Calculation (min): 27 min  PT Assessment / Plan / Recommendation Clinical Impression    Pt adm s/p unexplained fall in kitchen with T7 compression fx and LOC due to hitting her head. ? Seizure after fall. Pt has a h/o cognitive decline over the past two years and continues to undergo testing. Husband present and reports pt at baseline. His only concern is her having difficulty judging stepping down the stairs. Pt highly distractible with poor awareness of IV line and impulsive, therefore was deemed not currently safe to take into stairway. Will follow-up 5/3 prior to d/c to reassess any mobility or DME needs (unlikely DME needs as feel she would not attend to the device and it could become more of a hazard than a help).     PT Assessment  Patient needs continued PT services    Follow Up Recommendations  No PT follow up;Supervision for mobility/OOB    Does the patient have the potential to tolerate intense rehabilitation      Barriers to Discharge Decreased caregiver support initial 24/7 coverage, then prn    Equipment Recommendations  Other (comment) (TBD)    Recommendations for Other Services OT consult   Frequency Min 4X/week    Precautions / Restrictions Precautions Required Braces or Orthoses: Spinal Brace Spinal Brace: Thoracolumbosacral orthotic;Applied in sitting position (brace order was completed; bio-tech delivered end of Rx) Restrictions Other Position/Activity Restrictions: limit flexion   Pertinent Vitals/Pain Denied pain repeatedly throughout session      Mobility  Bed Mobility Bed Mobility: Rolling Right;Right Sidelying to Sit;Sitting - Scoot to Edge of Bed Rolling Right: 5: Supervision;With rail Right Sidelying to Sit: 5: Supervision;With rails Sitting - Scoot to Edge of Bed: 5: Supervision Details for Bed Mobility  Assistance: vc for sequencing to protect her back Transfers Transfers: Sit to Stand;Stand to Sit Sit to Stand: 4: Min guard Stand to Sit: 4: Min guard Details for Transfer Assistance: pt slightly impulsive and with poor awareness of IV line and telemetry monitor; slightly unsteady on initial standing Ambulation/Gait Ambulation/Gait Assistance: 4: Min guard Ambulation Distance (Feet): 200 Feet Assistive device: None Ambulation/Gait Assistance Details: slightly wide  base of support, initially slight stagger to her Rt; no overt loss of balance; pt naturally turning her head while walking (looking into pt rooms) Gait Pattern: Step-through pattern;Wide base of support Gait velocity: slower  Stairs: No         PT Diagnosis: Difficulty walking  PT Problem List: Decreased balance;Decreased cognition;Decreased knowledge of use of DME;Decreased safety awareness;Decreased knowledge of precautions PT Treatment Interventions: DME instruction;Gait training;Stair training;Functional mobility training;Therapeutic activities;Balance training;Cognitive remediation;Patient/family education   PT Goals Acute Rehab PT Goals PT Goal Formulation: With patient Time For Goal Achievement: 05/02/13 Potential to Achieve Goals: Good Pt will go Supine/Side to Sit: with supervision PT Goal: Supine/Side to Sit - Progress: Goal set today Pt will go Sit to Supine/Side: with supervision PT Goal: Sit to Supine/Side - Progress: Goal set today Pt will go Sit to Stand: with supervision PT Goal: Sit to Stand - Progress: Goal set today Pt will Ambulate: >150 feet;with supervision;with least restrictive assistive device PT Goal: Ambulate - Progress: Goal set today Pt will Go Up / Down Stairs: 3-5 stairs;with min assist PT Goal: Up/Down Stairs - Progress: Goal set today  Visit Information  Last PT Received On: 04/29/13 Assistance Needed: +1    Subjective Data  Subjective: I have problems finding the right words.   Patient Stated Goal: per husband, for pt to go home tomorrow   Prior Functioning  Home Living Lives With: Spouse;Son (29 yo) Available Help at Discharge: Family;Available PRN/intermittently (24 hr initially) Type of Home: House Home Access: Stairs to enter Entrance Stairs-Number of Steps: 3 Home Layout: Two level;Other (Comment) (husband had hospital bed put on 1st level) Alternate Level Stairs-Number of Steps: 12 Home Adaptive Equipment: None Additional Comments: Husband present most of session and reports pt typically does not have problems with balance/mobility EXCEPT she has a hard time judging the last few steps when descending stairs (she often hesitates, readjusts, and then steps from 2nd step to floor level). Has not fallen previously. He is not sure what caused her to fall backwards this time. Prior Function Level of Independence: Independent Able to Take Stairs?: Yes Communication Communication: Receptive difficulties;Expressive difficulties    Cognition  Cognition Arousal/Alertness: Awake/alert Behavior During Therapy: Flat affect Overall Cognitive Status: History of cognitive impairments - at baseline    Extremity/Trunk Assessment Right Upper Extremity Assessment RUE ROM/Strength/Tone: Deficits RUE ROM/Strength/Tone Deficits: prior shoulder injury with shoulder flexion to 90; otherwise AROM WFL Left Upper Extremity Assessment LUE ROM/Strength/Tone: WFL for tasks assessed Right Lower Extremity Assessment RLE ROM/Strength/Tone: [facility] for tasks assessed Left Lower Extremity Assessment LLE ROM/Strength/Tone: WFL for tasks assessed Trunk Assessment Trunk Assessment: Normal   Balance Balance Balance Assessed: Yes Static Standing Balance Static Standing - Balance Support: No upper extremity supported Static Standing - Level of Assistance: 7: Independent Single Leg Stance - Right Leg: 7 Single Leg Stance - Left Leg: 4 Rhomberg - Eyes Closed: 30 High Level Balance High  Level Balance Activites: Backward walking;Direction changes;Sudden stops;Head turns High Level Balance Comments: staggering x 1,   End of Session PT - End of Session Equipment Utilized During Treatment: Gait belt Activity Tolerance: Patient tolerated treatment well Patient left: in chair;with call bell/phone within reach;with chair alarm set Nurse Communication: Mobility status;Other (comment) (IV ringing; pt location and husband left)  GP Functional Assessment Tool Used: clinical judgement Functional Limitation: Mobility: Walking and moving around Mobility: Walking and Moving Around Current Status ([id number]): At least 1 percent but less than 20 percent impaired, limited or restricted Mobility: Walking and Moving Around Goal Status [phone]): At least 1 percent but less than 20 percent impaired, limited or restricted Mobility: Walking and Moving Around Discharge Status [phone]): At least 1 percent but less than 20 percent impaired, limited or restricted   Kieon Lawhorn 04/29/2013, 4:09 PM  04/29/2013 Veda Canning, PT Pager: [phone]

## 2013-04-29 NOTE — Progress Notes (Signed)
History: Cheyenne Tyler is a 52 y.o. female who for the past 2 years has been having problems with progressive difficulty thinking. 2 years ago, she had progressive difficulty with speech, and had a CT at that time though this subsequently improved and therefore they did not pursue further workup at that time.   For the past year she has been getting progressively more confused, though this seems to come in waves. Her husband describes that she can have an acute worsening that last for a few hours and then she is back to her normal self. Her husband is scheduling an appointment at [facility] for further evaluation with a cognitive specialist  She does have staring spells which can last for a few minutes at a time, followed by confusion.   On 04/28/2013 she had an episode where she fell suddenly to the ground, stiffened up, and had smacking movements of her lips. She was unresponsive with slow deliberate respirations for 5 or 10 minutes, and has subsequently been steadily improving with confusion.  Subjective: Patient lying in bed. Says she feels "a little better" with the medicine that we are giving her. She is able describe to me her memory issues for the past 2 years.  Objective: BP 92/58  Pulse 54  Temp(Src) 98 F (36.7 C) (Oral)  Resp 16  Ht 5\' 8"  (1.727 m)  Wt 69.4 kg (153 lb)  BMI 23.27 kg/m2  SpO2 99%  CBGs  Recent Labs  04/28/13 0835  GLUCAP 160*     Medications: Scheduled: . levETIRAcetam  500 mg Intravenous Q12H  . pantoprazole (PROTONIX) IV  40 mg Intravenous Q12H  . sodium chloride  3 mL Intravenous Q12H    Neurologic Exam: Mental Status: Alert, oriented, thought content appropriate.  Speech fluent without evidence of aphasia. Able to follow 3 step commands without difficulty. Cranial Nerves: II- Visual fields grossly intact. III/IV/VI-Extraocular movements intact.  Pupils reactive bilaterally. V/VII-Smile symmetric VIII-hearing grossly intact IX/X-normal  gag XI-bilateral shoulder shrug XII-midline tongue extension Motor: 5/5 bilaterally with normal tone and bulk Sensory: Light touch intact throughout, bilaterally Deep Tendon Reflexes: 2+ and symmetric throughout Plantars: Downgoing bilaterally Cerebellar: Normal finger-to-nose, normal rapid alternating movements and normal heel-to-shin test.   Lab Results: CBC:  Recent Labs Lab 04/28/13 0829 04/28/13 0905  WBC 7.4  --   NEUTROABS 5.5  --   HGB 13.4 13.6  HCT 38.7 40.0  MCV 88.4  --   PLT 198  --    Basic Metabolic Panel:  Recent Labs Lab 04/28/13 0829 04/28/13 0905 04/28/13 1716  NA 141 140  --   K 4.0 4.0  --   CL 106 107  --   CO2 28  --   --   GLUCOSE 146* 145*  --   BUN 20 20  --   CREATININE 0.81 0.80  --   CALCIUM 9.6  --   --   MG  --   --  2.1   Liver Function Tests:  Recent Labs Lab 04/28/13 0829  AST 19  ALT 14  ALKPHOS 52  BILITOT 0.5  PROT 6.6  ALBUMIN 3.7   Hemoglobin A1C: No results found for this basename: HGBA1C,  in the last 168 hours Fasting Lipid Panel: No results found for this basename: CHOL, HDL, LDLCALC, TRIG, CHOLHDL, LDLDIRECT,  in the last 168 hours Thyroid Function Tests:  Recent Labs Lab 04/28/13 1716  TSH 0.774   Coagulation: No results found for this basename: LABPROT, INR,  in  the last 168 hours   Study Results: Dg Chest 2 View  04/28/2013  *RADIOLOGY REPORT*  Clinical Data: Fall  CHEST - 2 VIEW  Comparison: None.  Findings: Cardiomediastinal silhouette is unremarkable.  No acute infiltrate or pulmonary edema.  No diagnostic pneumothorax. Moderate compression deformity mid thoracic spine of indeterminate age.  Clinical correlation is necessary.  IMPRESSION: No acute infiltrate or pulmonary edema.  No pneumothorax.  Moderate compression deformity mid thoracic spine of indeterminate age. Clinical correlation is necessary.   Original Report Authenticated By: Natasha Mead, M.D.    Dg Thoracic Spine W/swimmers  04/28/2013   *RADIOLOGY REPORT*  Clinical Data: Pain, fall, seizure  THORACIC SPINE - 2 VIEW + SWIMMERS  Comparison: None.  Findings: Three views of thoracic spine submitted.  Alignment and disc spaces are preserved.  There is moderate compression deformity mid thoracic spine of indeterminate age.  Clinical correlation is necessary.  If recent fracture is suspected further evaluation with CT scan or MRI is recommended.  IMPRESSION: Moderate compression deformity mid thoracic spine of indeterminate age.  Clinical correlation is necessary.   Original Report Authenticated By: Natasha Mead, M.D.    Dg Lumbar Spine Complete  04/28/2013  *RADIOLOGY REPORT*  Clinical Data: Loss of consciousness  LUMBAR SPINE - COMPLETE 4+ VIEW  Comparison: None.  Findings: Five views of the lumbar spine submitted.  No acute fracture or subluxation.  Alignment, disc spaces and vertebral height are preserved.  IMPRESSION: No acute fracture or subluxation.   Original Report Authenticated By: Natasha Mead, M.D.    Ct Head Wo Contrast  04/28/2013  *RADIOLOGY REPORT*  Clinical Data: Loss of consciousness and trauma; question seizure  CT HEAD WITHOUT CONTRAST  Technique:  Contiguous axial images were obtained from the base of the skull through the vertex without contrast.  Comparison: February 13, 2012  Findings: There is mild diffuse atrophy which is stable.  There is no mass, hemorrhage, extra-axial fluid collection, or midline shift.  Minimal periventricular small vessel disease in the centra semiovale bilaterally is stable.  There is no acute infarct apparent.  There is no new gray-white compartment lesions.  Bony calvarium appears intact.  The mastoid air cells are clear.  IMPRESSION: Mild diffuse atrophy with minimal small vessel disease, stable.  No mass, hemorrhage, or acute appearing infarct.   Original Report Authenticated By: Bretta Bang, M.D.    Ct Cervical Spine Wo Contrast  04/28/2013  *RADIOLOGY REPORT*  Clinical Data: Pain post trauma  CT  CERVICAL SPINE WITHOUT CONTRAST  Technique:  Multidetector CT imaging of the cervical spine was performed. Multiplanar CT image reconstructions were also generated.  Comparison: None.  Findings: There is no fracture or spondylolisthesis.  Prevertebral soft tissues and predental space regions are normal.  There is mild disc space narrowing at C5-6 and C6-7.  There is mild facet hypertrophy at multiple levels bilaterally.  No disc extrusion or stenosis.  There is diffuse inhomogeneity to the appearance of the thyroid.  IMPRESSION: Mild osteoarthritic change.  No fracture or spondylolisthesis.  Diffusely abnormal appearance of the thyroid.  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  This finding may warrant correlation with thyroid ultrasound to further evaluate.   Original Report Authenticated By: Bretta Bang, M.D.    Ct Thoracic Spine Wo Contrast  04/28/2013  *RADIOLOGY REPORT*  Clinical Data: Pain.  Abnormal plain films.  CT THORACIC SPINE WITHOUT CONTRAST  Technique:  Multidetector CT imaging of the thoracic spine was performed without intravenous contrast administration. Multiplanar CT image reconstructions were also  generated  Comparison: Plain films thoracic spine earlier this same date.  Findings: As seen on plain films, there is a biconcave compression fracture deformity of T7.  Vertebral body height loss centrally is estimated at 70%.  The fracture appears acute or subacute with sharp fracture margins.  Minimal bony retropulsion is seen at the level of the mid vertebral body.  There is no extension into the posterior elements.  No other fracture is identified. The central canal and foramina appear open at all levels.  Convex right scoliosis is noted.  Tiny Schmorl's node in the superior endplate of T11 is identified.  Paraspinous structures demonstrate a diffusely heterogeneous thyroid gland.  Imaged lung parenchyma shows only dependent atelectatic change.  IMPRESSION:  1.  Acute/subacute biconcave compression  fracture deformity of T7 with approximate 70% vertebral body height loss.  No extension into the posterior elements or malalignment is identified.  No acquired central canal stenosis is present. 2.  Diffusely heterogeneous thyroid gland.  Ultrasound could be used for further evaluation.   Original Report Authenticated By: Holley Dexter, M.D.    US Soft Tissue Head/neck  04/29/2013  *RADIOLOGY REPORT*  Clinical Data: Thyroidomegaly  THYROID ULTRASOUND  Technique: Ultrasound examination of the thyroid gland and adjacent soft tissues was performed.  Comparison:  C-spine CT 04/28/2013  Findings:  Right thyroid lobe:  5.0 x 2.2 x 1.8 cm. Left thyroid lobe:  5.2 x 2.0 x 1.8 cm. Isthmus:  0.4 cm  Focal nodules:  The thyroid is diffusely inhomogeneous.  Right inferior thyroid lobe nodule, solid, 1.3 x 1.0 x 0.8 cm.  At real time exam, the sonographer appreciated a left upper thyroid lobe solid nodule measuring 0.9 x 0.7 x 0.5 cm  Lymphadenopathy:  None visualized.  IMPRESSION: Diffusely inhomogeneous, enlarged thyroid which may be seen with thyroiditis.  No nodule identified that meets standard consensus criteria for fine needle aspiration. Findings do not meet current SRU consensus criteria for biopsy.  Follow-up by clinical exam is recommended.  If patient has known risk factors for thyroid carcinoma, consider follow-up ultrasound in 12 months.  If patient is clinically hyperthyroid, consider nuclear medicine thyroid uptake and scan.   Original Report Authenticated By: Christiana Pellant, M.D.    Dg Abd Portable 1v  04/28/2013  *RADIOLOGY REPORT*  Clinical Data: Nausea.  Vomiting.  PORTABLE ABDOMEN - 1 VIEW  Comparison: None.  Findings: Single view of the abdomen is submitted for interpretation.  This supine view shows no gross plain film evidence of free air.  Visualized bowel gas pattern is within normal limits.  Monitoring leads project over the abdomen. Phleboliths in the pelvis.  Rectum is inadequately visualized.   The right renal shadow appears ptotic. There is calcification adjacent to the right L4 vertebra.  Correlation for hematuria is recommended.  This calcification measures about 8 mm.   The calcification could be in the right renal collecting system or renal pelvis.  Potentially this represents a mulberry stone.  IMPRESSION: Nonobstructive bowel gas pattern.  Calcification to the right of the L4 vertebra potentially represents urinary calculus.   Original Report Authenticated By: Andreas Newport, M.D.     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 Her outpatient workup for memory loss has only revealed elevated thyroid antibodies.(TSH is normal itself). Paraneoplastic panel, to do unless it has been done as outpatient. Consider course of steroids only if her dementia workup at  [facility] is delayed. Otherwise, await EEG (has not been done yet today), but would keep on keppra at this point as she seems stable. Will continue to follow.   LOS: 1 day   Job Founds, MBA, [facility] Pager [phone] 04/29/2013  12:17 PM

## 2013-04-29 NOTE — Telephone Encounter (Signed)
I have called with Cheyenne Tyler, Riggenbach has seizure, on May 1st 2014,

## 2013-04-29 NOTE — Progress Notes (Signed)
[facility] PROGRESS NOTE Assessment/Plan:  Seizure: - keppra. No Siezure events overnight. - ct head 5.1.2014 : stable. - EEG pending. - paraneoplastic panel.    Nausea & vomiting: - PPI, KUB no acute pathology appriciated.   T7 compression fracture: - pain control. - PT consult.     Code Status: Full Family Communication: none  Disposition Plan: home ina am   Consultants:  NEurology  Procedures:  EEG.  Antibiotics:  None  HPI/Subjective: Patient relates no complains.  Objective: Filed Vitals:   04/28/13 1820 04/28/13 2210 04/29/13 0142 04/29/13 0621  BP: 107/66 106/67 99/65 92/58   Pulse: 66 64 52 54  Temp: 98.2 F (36.8 C) 98.4 F (36.9 C) 98.3 F (36.8 C) 98 F (36.7 C)  TempSrc: Oral Oral Oral Oral  Resp: 16 16 16 16   Height:  5\' 8"  (1.727 m)    Weight: 65.5 kg (144 lb 6.4 oz) 65.5 kg (144 lb 6.4 oz)  69.4 kg (153 lb)  SpO2: 99% 98% 98% 99%   No intake or output data in the 24 hours ending 04/29/13 0820 Filed Weights   04/28/13 1820 04/28/13 2210 04/29/13 0621  Weight: 65.5 kg (144 lb 6.4 oz) 65.5 kg (144 lb 6.4 oz) 69.4 kg (153 lb)    Exam:  General: Alert, awake, oriented x3, in no acute distress.  HEENT: No bruits, no goiter.  Heart: Regular rate and rhythm, without murmurs, rubs, gallops.  Lungs: Good air movement, clear to auscultation.  Abdomen: Soft, nontender, nondistended, positive bowel sounds.  Neuro: Grossly intact, nonfocal.   Data Reviewed: Basic Metabolic Panel:  Recent Labs Lab 04/28/13 0829 04/28/13 0905 04/28/13 1716  NA 141 140  --   K 4.0 4.0  --   CL 106 107  --   CO2 28  --   --   GLUCOSE 146* 145*  --   BUN 20 20  --   CREATININE 0.81 0.80  --   CALCIUM 9.6  --   --   MG  --   --  2.1   Liver Function Tests:  Recent Labs Lab 04/28/13 0829  AST 19  ALT 14  ALKPHOS 52  BILITOT 0.5  PROT 6.6  ALBUMIN 3.7    Recent Labs Lab 04/28/13 1716  LIPASE 12   No results found for this  basename: AMMONIA,  in the last 168 hours CBC:  Recent Labs Lab 04/28/13 0829 04/28/13 0905  WBC 7.4  --   NEUTROABS 5.5  --   HGB 13.4 13.6  HCT 38.7 40.0  MCV 88.4  --   PLT 198  --    Cardiac Enzymes:  Recent Labs Lab 04/28/13 1720 04/28/13 2348 04/29/13 0540  TROPONINI <0.30 <0.30 <0.30   BNP (last 3 results) No results found for this basename: PROBNP,  in the last 8760 hours CBG:  Recent Labs Lab 04/28/13 0835  GLUCAP 160*    Recent Results (from the past 240 hour(s))  GRAM STAIN     Status: None   Collection Time    04/20/13 12:50 PM      Result Value Range Status   GRAM STAIN No WBC Seen   Final   GRAM STAIN No Organisms Seen   Final  FUNGUS CULTURE W SMEAR     Status: None   Collection Time    04/20/13 12:50 PM      Result Value Range Status   Preliminary Report Culture in Progress for 4 Weeks   Preliminary  Smear Result No Yeast or Fungal Elements Seen   Preliminary     Studies: Dg Chest 2 View  04/28/2013  *RADIOLOGY REPORT*  Clinical Data: Fall  CHEST - 2 VIEW  Comparison: None.  Findings: Cardiomediastinal silhouette is unremarkable.  No acute infiltrate or pulmonary edema.  No diagnostic pneumothorax. Moderate compression deformity mid thoracic spine of indeterminate age.  Clinical correlation is necessary.  IMPRESSION: No acute infiltrate or pulmonary edema.  No pneumothorax.  Moderate compression deformity mid thoracic spine of indeterminate age. Clinical correlation is necessary.   Original Report Authenticated By: Natasha Mead, M.D.    Dg Thoracic Spine W/swimmers  04/28/2013  *RADIOLOGY REPORT*  Clinical Data: Pain, fall, seizure  THORACIC SPINE - 2 VIEW + SWIMMERS  Comparison: None.  Findings: Three views of thoracic spine submitted.  Alignment and disc spaces are preserved.  There is moderate compression deformity mid thoracic spine of indeterminate age.  Clinical correlation is necessary.  If recent fracture is suspected further evaluation with CT  scan or MRI is recommended.  IMPRESSION: Moderate compression deformity mid thoracic spine of indeterminate age.  Clinical correlation is necessary.   Original Report Authenticated By: Natasha Mead, M.D.    Dg Lumbar Spine Complete  04/28/2013  *RADIOLOGY REPORT*  Clinical Data: Loss of consciousness  LUMBAR SPINE - COMPLETE 4+ VIEW  Comparison: None.  Findings: Five views of the lumbar spine submitted.  No acute fracture or subluxation.  Alignment, disc spaces and vertebral height are preserved.  IMPRESSION: No acute fracture or subluxation.   Original Report Authenticated By: Natasha Mead, M.D.    Ct Head Wo Contrast  04/28/2013  *RADIOLOGY REPORT*  Clinical Data: Loss of consciousness and trauma; question seizure  CT HEAD WITHOUT CONTRAST  Technique:  Contiguous axial images were obtained from the base of the skull through the vertex without contrast.  Comparison: February 13, 2012  Findings: There is mild diffuse atrophy which is stable.  There is no mass, hemorrhage, extra-axial fluid collection, or midline shift.  Minimal periventricular small vessel disease in the centra semiovale bilaterally is stable.  There is no acute infarct apparent.  There is no new gray-white compartment lesions.  Bony calvarium appears intact.  The mastoid air cells are clear.  IMPRESSION: Mild diffuse atrophy with minimal small vessel disease, stable.  No mass, hemorrhage, or acute appearing infarct.   Original Report Authenticated By: Bretta Bang, M.D.    Ct Cervical Spine Wo Contrast  04/28/2013  *RADIOLOGY REPORT*  Clinical Data: Pain post trauma  CT CERVICAL SPINE WITHOUT CONTRAST  Technique:  Multidetector CT imaging of the cervical spine was performed. Multiplanar CT image reconstructions were also generated.  Comparison: None.  Findings: There is no fracture or spondylolisthesis.  Prevertebral soft tissues and predental space regions are normal.  There is mild disc space narrowing at C5-6 and C6-7.  There is mild facet  hypertrophy at multiple levels bilaterally.  No disc extrusion or stenosis.  There is diffuse inhomogeneity to the appearance of the thyroid.  IMPRESSION: Mild osteoarthritic change.  No fracture or spondylolisthesis.  Diffusely abnormal appearance of the thyroid.  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  This finding may warrant correlation with thyroid ultrasound to further evaluate.   Original Report Authenticated By: Bretta Bang, M.D.    Ct Thoracic Spine Wo Contrast  04/28/2013  *RADIOLOGY REPORT*  Clinical Data: Pain.  Abnormal plain films.  CT THORACIC SPINE WITHOUT CONTRAST  Technique:  Multidetector CT imaging of the thoracic spine was performed without intravenous  contrast administration. Multiplanar CT image reconstructions were also generated  Comparison: Plain films thoracic spine earlier this same date.  Findings: As seen on plain films, there is a biconcave compression fracture deformity of T7.  Vertebral body height loss centrally is estimated at 70%.  The fracture appears acute or subacute with sharp fracture margins.  Minimal bony retropulsion is seen at the level of the mid vertebral body.  There is no extension into the posterior elements.  No other fracture is identified. The central canal and foramina appear open at all levels.  Convex right scoliosis is noted.  Tiny Schmorl's node in the superior endplate of T11 is identified.  Paraspinous structures demonstrate a diffusely heterogeneous thyroid gland.  Imaged lung parenchyma shows only dependent atelectatic change.  IMPRESSION:  1.  Acute/subacute biconcave compression fracture deformity of T7 with approximate 70% vertebral body height loss.  No extension into the posterior elements or malalignment is identified.  No acquired central canal stenosis is present. 2.  Diffusely heterogeneous thyroid gland.  Ultrasound could be used for further evaluation.   Original Report Authenticated By: Holley Dexter, M.D.    Dg Abd Portable 1v  04/28/2013   *RADIOLOGY REPORT*  Clinical Data: Nausea.  Vomiting.  PORTABLE ABDOMEN - 1 VIEW  Comparison: None.  Findings: Single view of the abdomen is submitted for interpretation.  This supine view shows no gross plain film evidence of free air.  Visualized bowel gas pattern is within normal limits.  Monitoring leads project over the abdomen. Phleboliths in the pelvis.  Rectum is inadequately visualized.  The right renal shadow appears ptotic. There is calcification adjacent to the right L4 vertebra.  Correlation for hematuria is recommended.  This calcification measures about 8 mm.   The calcification could be in the right renal collecting system or renal pelvis.  Potentially this represents a mulberry stone.  IMPRESSION: Nonobstructive bowel gas pattern.  Calcification to the right of the L4 vertebra potentially represents urinary calculus.   Original Report Authenticated By: Andreas Newport, M.D.     Scheduled Meds: . levETIRAcetam  500 mg Intravenous Q12H  . pantoprazole (PROTONIX) IV  40 mg Intravenous Q12H  . sodium chloride  3 mL Intravenous Q12H   Continuous Infusions: . sodium chloride 100 mL/hr (04/29/13 0452)     Marinda Elk  [facility] Pager [phone]. If 8PM-8AM, please contact night-coverage at [email or web address], password [facility] 04/29/2013, 8:20 AM  LOS: 1 day

## 2013-04-29 NOTE — Progress Notes (Signed)
EEG completed.

## 2013-04-29 NOTE — Telephone Encounter (Signed)
She is admitted to hospital now, T7 compression fracture, on chest brace,  She has neuropsychology evaluation in May 6th. Further discussion to follow after evaluation

## 2013-04-30 DIAGNOSIS — R569 Unspecified convulsions: Secondary | ICD-10-CM

## 2013-04-30 DIAGNOSIS — R112 Nausea with vomiting, unspecified: Secondary | ICD-10-CM

## 2013-04-30 DIAGNOSIS — R413 Other amnesia: Secondary | ICD-10-CM

## 2013-04-30 MED ORDER — OXYCODONE-ACETAMINOPHEN 5-325 MG PO TABS: ORAL_TABLET | ORAL | Status: DC

## 2013-04-30 MED ORDER — LEVETIRACETAM 500 MG PO TABS: 500.0000 mg | ORAL_TABLET | Freq: Two times a day (BID) | ORAL | Status: DC

## 2013-04-30 MED ORDER — ONDANSETRON 4 MG PO TBDP: 4.0000 mg | ORAL_TABLET | Freq: Three times a day (TID) | ORAL | Status: DC | PRN

## 2013-04-30 NOTE — Procedures (Signed)
EEG NUMBER:  14 - [id number].  REFERRING PHYSICIAN:  Marinda Elk, M.D.  INDICATION FOR STUDY:  A 52 year old lady with progressive memory difficulty for 2 years, presenting with new onset seizure activity.  DESCRIPTION:  This is a routine EEG recording performed during wakefulness.  Predominant background activity consisted of low-to- moderate amplitude, diffuse 1-2 Hz delta activity with superimposed 6-7 Hz theta activity, which was somewhat rhythmic posteriorly at times. There were frequent brief runs of moderate to high amplitude, generalized delta activity with highest amplitudes in the frontal regions lasting up to 30 seconds.  Photic stimulation was not performed. Hyperventilation was not performed.  No epileptiform discharges were recorded.  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  No epileptiform activity was seen.     Cheyenne Christmas, MD    AV:[id number] D:  04/30/2013 07:39:40  T:  04/30/2013 07:55:16  Job #:  [id number]

## 2013-04-30 NOTE — Discharge Summary (Signed)
Physician Discharge Summary  Nadiya Pieratt VHQ:[id number] DOB: 11/22/1961 DOA: 04/28/2013  PCP: Thora Lance, MD  Admit date: 04/28/2013 Discharge date: 04/30/2013  Time spent: 35 minutes  Recommendations for Outpatient Follow-up:  1. Follow up with [facility] neurologist and Dr. Terrace Arabia for further evaluation  Discharge Diagnoses:  Active Problems:   Memory loss   Seizure   Nausea & vomiting   Discharge Condition: stable  Diet recommendation: heart healthy  Filed Weights   04/29/13 0621 04/30/13 0500 04/30/13 4132  Weight: 69.4 kg (153 lb) 70 kg (154 lb 5.2 oz) 70 kg (154 lb 5.2 oz)    History of present illness:  52 y.o. female with PMH significant for memory and cognitive problems worse over last year, she started to see Dr Threasa Beards for evaluation of the same. Patient was at baseline when the morning of admission while she was in the kitchen reaching for coffee she fell backward. Husband witness fall. Patient was lying on the floor on fetal position, she was drooling and having sucking motion of her mouth. She was not responsive for 10 minutes. After she regain consciousness she was confused. Patient only recall that she was going to get coffee. She denies chest pain or palpitation prior to episode. No history of fever, dysuria, or cough. She is having back pain, started after the fall.  Of notes her work up for memory problems include TSH, normal, HIV test negative, LP negative for bacterial Infection, RF negative. She had positive thyroglobulin AB and thyroid peroxidase antibody.    Hospital Course:  Seizure:  - Initially admitted and started on Keppra. With no further seizures. - ct head, cervical and thoracic 5.1.2014 as below. - EEG: showed no foci on seizure but slowing of wave's. - change Keppra to oral, follow up with [facility].  Nausea & vomiting:  - PPI, KUB no acute pathology appriciated.  - most likely due to narcoitcs. zofran  T7 compression fracture:  - pain control. Continue  brace - PT consult recommended no home PT. - ct thorax: Acute/subacute biconcave compression fracture deformity of T7  with approximate 70% vertebral body height loss. No extension into the posterior elements or malalignment is identified. No acquired  central canal stenosis is present    Procedures: EEG CT head, cervical and thoracic  Consultations:  Neurology  Discharge Exam: Filed Vitals:   04/29/13 2148 04/30/13 0500 04/30/13 0552 04/30/13 0623  BP: 115/72  113/72   Pulse: 60  75   Temp: 98.5 F (36.9 C)  99.1 F (37.3 C)   TempSrc: Oral  Oral   Resp: 16  18   Height:      Weight:  70 kg (154 lb 5.2 oz)  70 kg (154 lb 5.2 oz)  SpO2: 100%  100%     General: A&O x2 Cardiovascular: RRR Respiratory: good air movement CTA B/L  Discharge Instructions  Discharge Orders   Future Appointments Provider Department Dept Phone   05/03/2013 2:45 PM Levert Feinstein, MD [facility] [phone]   Future Orders Complete By Expires     Diet - low sodium heart healthy  As directed     Increase activity slowly  As directed         Medication List    TAKE these medications       butalbital-acetaminophen-caffeine 50-325-40 MG per tablet  Commonly known as:  FIORICET, ESGIC  Take 1 tablet by mouth every 6 (six) hours as needed for pain or headache.     fish  oil-omega-3 fatty acids 1000 MG capsule  Take 2 g by mouth 3 (three) times daily.     GINKGO BILOBA PO  Take 1 capsule by mouth 3 (three) times daily.     ibuprofen 200 MG tablet  Commonly known as:  ADVIL,MOTRIN  Take 200 mg by mouth every 6 (six) hours as needed for pain.     levETIRAcetam 500 MG tablet  Commonly known as:  KEPPRA  Take 1 tablet (500 mg total) by mouth 2 (two) times daily.     oxyCODONE-acetaminophen 5-325 MG per tablet  Commonly known as:  PERCOCET/ROXICET  1 to 2 tabs PO q6hrs  PRN for pain     VITAMIN E COMPLETE PO  Take 1 tablet by mouth 3 (three) times daily.       No  Known Allergies     Follow-up Information   Follow up with Thora Lance, MD In 2 days.   Contact information:   [address] [place] [address] [phone]       Follow up with Levert Feinstein, MD In 2 days.   Contact information:   [address] [place] [address] [phone]       Follow up with [facility] EMERGENCY DEPARTMENT. (If symptoms worsen)    Contact information:   [address] [id number] [place] [address] [phone]      Follow up with [facility] EMERGENCY DEPARTMENT. (If symptoms worsen)    Contact information:   [address] [id number] [place] [address] [phone]       The results of significant diagnostics from this hospitalization (including imaging, microbiology, ancillary and laboratory) are listed below for reference.    Significant Diagnostic Studies: Dg Chest 2 View  04/28/2013  *RADIOLOGY REPORT*  Clinical Data: Fall  CHEST - 2 VIEW  Comparison: None.  Findings: Cardiomediastinal silhouette is unremarkable.  No acute infiltrate or pulmonary edema.  No diagnostic pneumothorax. Moderate compression deformity mid thoracic spine of indeterminate age.  Clinical correlation is necessary.  IMPRESSION: No acute infiltrate or pulmonary edema.  No pneumothorax.  Moderate compression deformity mid thoracic spine of indeterminate age. Clinical correlation is necessary.   Original Report Authenticated By: Natasha Mead, M.D.    Dg Thoracic Spine W/swimmers  04/28/2013  *RADIOLOGY REPORT*  Clinical Data: Pain, fall, seizure  THORACIC SPINE - 2 VIEW + SWIMMERS  Comparison: None.  Findings: Three views of thoracic spine submitted.  Alignment and disc spaces are preserved.  There is moderate compression deformity mid thoracic spine of indeterminate age.  Clinical correlation is necessary.  If recent fracture is suspected further evaluation with CT scan or MRI is recommended.   IMPRESSION: Moderate compression deformity mid thoracic spine of indeterminate age.  Clinical correlation is necessary.   Original Report Authenticated By: Natasha Mead, M.D.    Dg Lumbar Spine Complete  04/28/2013  *RADIOLOGY REPORT*  Clinical Data: Loss of consciousness  LUMBAR SPINE - COMPLETE 4+ VIEW  Comparison: None.  Findings: Five views of the lumbar spine submitted.  No acute fracture or subluxation.  Alignment, disc spaces and vertebral height are preserved.  IMPRESSION: No acute fracture or subluxation.   Original Report Authenticated By: Natasha Mead, M.D.    Ct Head Wo Contrast  04/28/2013  *RADIOLOGY REPORT*  Clinical Data: Loss of consciousness and trauma; question seizure  CT HEAD WITHOUT CONTRAST  Technique:  Contiguous axial images were obtained from the base of the skull through the vertex  without contrast.  Comparison: February 13, 2012  Findings: There is mild diffuse atrophy which is stable.  There is no mass, hemorrhage, extra-axial fluid collection, or midline shift.  Minimal periventricular small vessel disease in the centra semiovale bilaterally is stable.  There is no acute infarct apparent.  There is no new gray-white compartment lesions.  Bony calvarium appears intact.  The mastoid air cells are clear.  IMPRESSION: Mild diffuse atrophy with minimal small vessel disease, stable.  No mass, hemorrhage, or acute appearing infarct.   Original Report Authenticated By: Bretta Bang, M.D.    Ct Cervical Spine Wo Contrast  04/28/2013  *RADIOLOGY REPORT*  Clinical Data: Pain post trauma  CT CERVICAL SPINE WITHOUT CONTRAST  Technique:  Multidetector CT imaging of the cervical spine was performed. Multiplanar CT image reconstructions were also generated.  Comparison: None.  Findings: There is no fracture or spondylolisthesis.  Prevertebral soft tissues and predental space regions are normal.  There is mild disc space narrowing at C5-6 and C6-7.  There is mild facet hypertrophy at multiple levels  bilaterally.  No disc extrusion or stenosis.  There is diffuse inhomogeneity to the appearance of the thyroid.  IMPRESSION: Mild osteoarthritic change.  No fracture or spondylolisthesis.  Diffusely abnormal appearance of the thyroid.  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  This finding may warrant correlation with thyroid ultrasound to further evaluate.   Original Report Authenticated By: Bretta Bang, M.D.    Ct Thoracic Spine Wo Contrast  04/28/2013  *RADIOLOGY REPORT*  Clinical Data: Pain.  Abnormal plain films.  CT THORACIC SPINE WITHOUT CONTRAST  Technique:  Multidetector CT imaging of the thoracic spine was performed without intravenous contrast administration. Multiplanar CT image reconstructions were also generated  Comparison: Plain films thoracic spine earlier this same date.  Findings: As seen on plain films, there is a biconcave compression fracture deformity of T7.  Vertebral body height loss centrally is estimated at 70%.  The fracture appears acute or subacute with sharp fracture margins.  Minimal bony retropulsion is seen at the level of the mid vertebral body.  There is no extension into the posterior elements.  No other fracture is identified. The central canal and foramina appear open at all levels.  Convex right scoliosis is noted.  Tiny Schmorl's node in the superior endplate of T11 is identified.  Paraspinous structures demonstrate a diffusely heterogeneous thyroid gland.  Imaged lung parenchyma shows only dependent atelectatic change.  IMPRESSION:  1.  Acute/subacute biconcave compression fracture deformity of T7 with approximate 70% vertebral body height loss.  No extension into the posterior elements or malalignment is identified.  No acquired central canal stenosis is present. 2.  Diffusely heterogeneous thyroid gland.  Ultrasound could be used for further evaluation.   Original Report Authenticated By: Holley Dexter, M.D.    Mr Brain Wo Contrast  04/01/2013  [facility]  NEUROIMAGING REPORT   STUDY DATE: 03/31/2013 PATIENT NAME: Yashvi Jasinski DOB: [birth year] MRN: [id number]  ORDERING CLINICIAN: Dr Terrace Arabia CLINICAL HISTORY: 52 year-old patient being evaluated for memory loss and headaches EXAM: MRI of the brain without  TECHNIQUE: MRI of the brain without contrast was obtained utilizing 5 mm axial slices with T1, T2, T2 flair, T2 star gradient echo and diffusion weighted views.  T1 sagittal and T2 coronal views were obtained. Comparison CT head 02/13/2012 CONTRAST: None IMAGING SITE: [facility] FINDINGS:  The brain parenchyma shows mild changes of periventricular and subcortical white matter hyperintensities consistent with chronic macrovascular ischemia. There is  moderate degree of generalized cerebral atrophy and cavitation of subarachnoid spaces and ventricular system which is age disproportionate. No structural lesion, tumor infarcts are noted. Diffusion weighted imaging is negative for acute ischemia. Extraaxial brain structures appear  normal. Orbits appear unremarkable. Paranasal sinuses show no significant abnormalities. Pituitary gland and cerebellar tonsils within normal. Flow voids of the large vessels of ventricles occlusion appeared to be patent. Visualized portion of the upper cervical spine shows no significant abnormalities. Calvarium shows no significant abnormalities.  IMPRESSION:  This MRI scan of the brain shows moderate degree of generalized cerebral atrophy and mild degree of changes of chronic microvascular ischemia. Overall slight worsening of cortical atrophy as compared with previous CT head dated 02/13/2012.   INTERPRETING PHYSICIAN:  Delia Heady, MD Certified in Neurology,Vascular neurology and Neuroimaging  [facility] [address], [address] [place], [place] [address] [phone]     US Soft Tissue Head/neck  04/29/2013  *RADIOLOGY REPORT*  Clinical Data: Thyroidomegaly  THYROID ULTRASOUND  Technique: Ultrasound examination  of the thyroid gland and adjacent soft tissues was performed.  Comparison:  C-spine CT 04/28/2013  Findings:  Right thyroid lobe:  5.0 x 2.2 x 1.8 cm. Left thyroid lobe:  5.2 x 2.0 x 1.8 cm. Isthmus:  0.4 cm  Focal nodules:  The thyroid is diffusely inhomogeneous.  Right inferior thyroid lobe nodule, solid, 1.3 x 1.0 x 0.8 cm.  At real time exam, the sonographer appreciated a left upper thyroid lobe solid nodule measuring 0.9 x 0.7 x 0.5 cm  Lymphadenopathy:  None visualized.  IMPRESSION: Diffusely inhomogeneous, enlarged thyroid which may be seen with thyroiditis.  No nodule identified that meets standard consensus criteria for fine needle aspiration. Findings do not meet current SRU consensus criteria for biopsy.  Follow-up by clinical exam is recommended.  If patient has known risk factors for thyroid carcinoma, consider follow-up ultrasound in 12 months.  If patient is clinically hyperthyroid, consider nuclear medicine thyroid uptake and scan.   Original Report Authenticated By: Christiana Pellant, M.D.    Dg Abd Portable 1v  04/28/2013  *RADIOLOGY REPORT*  Clinical Data: Nausea.  Vomiting.  PORTABLE ABDOMEN - 1 VIEW  Comparison: None.  Findings: Single view of the abdomen is submitted for interpretation.  This supine view shows no gross plain film evidence of free air.  Visualized bowel gas pattern is within normal limits.  Monitoring leads project over the abdomen. Phleboliths in the pelvis.  Rectum is inadequately visualized.  The right renal shadow appears ptotic. There is calcification adjacent to the right L4 vertebra.  Correlation for hematuria is recommended.  This calcification measures about 8 mm.   The calcification could be in the right renal collecting system or renal pelvis.  Potentially this represents a mulberry stone.  IMPRESSION: Nonobstructive bowel gas pattern.  Calcification to the right of the L4 vertebra potentially represents urinary calculus.   Original Report Authenticated By: Andreas Newport, M.D.    Dg Fluoro Guide Lumbar Puncture  04/20/2013  *RADIOLOGY REPORT*  Clinical Data:  Memory loss.  DIAGNOSTIC LUMBAR PUNCTURE UNDER FLUOROSCOPIC GUIDANCE  Fluoroscopy time:  32 seconds  Technique:  Informed consent was obtained from the patient prior to the procedure, including potential complications of headache, allergy, and pain.   With the patient prone, the lower back was prepped with Betadine.  1% Lidocaine was used for local anesthesia. Lumbar puncture was performed at the L3-L4 level using a 20 gauge needle with return of clear CSF with an opening  pressure of  12 cm water.   15 ml of CSF were obtained for laboratory studies.  The patient tolerated the procedure well and there were no apparent complications.  IMPRESSION: Technically successful diagnostic lumbar puncture at L3-L4.  Clear CSF.  Normal opening pressure.   Original Report Authenticated By: Davonna Belling, M.D.     Microbiology: Recent Results (from the past 240 hour(s))  GRAM STAIN     Status: None   Collection Time    04/20/13 12:50 PM      Result Value Range Status   GRAM STAIN No WBC Seen   Final   GRAM STAIN No Organisms Seen   Final  FUNGUS CULTURE W SMEAR     Status: None   Collection Time    04/20/13 12:50 PM      Result Value Range Status   Preliminary Report Culture in Progress for 4 Weeks   Preliminary   Smear Result No Yeast or Fungal Elements Seen   Preliminary  URINE CULTURE     Status: None   Collection Time    04/28/13  8:51 AM      Result Value Range Status   Specimen Description URINE, CATHETERIZED   Final   Special Requests NONE   Final   Culture  Setup Time 04/28/2013 18:17   Final   Colony Count NO GROWTH   Final   Culture NO GROWTH   Final   Report Status 04/29/2013 FINAL   Final     Labs: Basic Metabolic Panel:  Recent Labs Lab 04/28/13 0829 04/28/13 0905 04/28/13 1716  NA 141 140  --   K 4.0 4.0  --   CL 106 107  --   CO2 28  --   --   GLUCOSE 146* 145*  --   BUN 20 20   --   CREATININE 0.81 0.80  --   CALCIUM 9.6  --   --   MG  --   --  2.1   Liver Function Tests:  Recent Labs Lab 04/28/13 0829  AST 19  ALT 14  ALKPHOS 52  BILITOT 0.5  PROT 6.6  ALBUMIN 3.7    Recent Labs Lab 04/28/13 1716  LIPASE 12   No results found for this basename: AMMONIA,  in the last 168 hours CBC:  Recent Labs Lab 04/28/13 0829 04/28/13 0905  WBC 7.4  --   NEUTROABS 5.5  --   HGB 13.4 13.6  HCT 38.7 40.0  MCV 88.4  --   PLT 198  --    Cardiac Enzymes:  Recent Labs Lab 04/28/13 1720 04/28/13 2348 04/29/13 0540  TROPONINI <0.30 <0.30 <0.30   BNP: BNP (last 3 results) No results found for this basename: PROBNP,  in the last 8760 hours CBG:  Recent Labs Lab 04/28/13 0835  GLUCAP 160*       Signed:  FELIZ ORTIZ, ABRAHAM  [facility] 04/30/2013, 10:05 AM

## 2013-04-30 NOTE — Progress Notes (Signed)
UR Completed Ladd Cen Graves-Bigelow, RN,BSN [phone]  

## 2013-04-30 NOTE — Progress Notes (Signed)
Physical Therapy Treatment Patient Details Name: Cheyenne Tyler MRN: [id number] DOB: 1961/10/15 Today's Date: 04/30/2013 Time: 5621-3086 PT Time Calculation (min): 16 min  PT Assessment / Plan / Recommendation Comments on Treatment Session  Pt moves well however remains slightly impulsive at times.  Pt ambulated ~400' with no AD, & completed stair training today.  Do not feel pt needs any DME at this time.  Cont to recommend (S) for mobility at d/c.      Follow Up Recommendations  No PT follow up;Supervision for mobility/OOB     Does the patient have the potential to tolerate intense rehabilitation     Barriers to Discharge        Equipment Recommendations       Recommendations for Other Services OT consult  Frequency Min 4X/week   Plan Discharge plan remains appropriate    Precautions / Restrictions Precautions Required Braces or Orthoses: Spinal Brace Spinal Brace: Thoracolumbosacral orthotic;Applied in sitting position Restrictions Other Position/Activity Restrictions: limit flexion   Pertinent Vitals/Pain Denies any pain/discomfort    Mobility  Bed Mobility Bed Mobility: Not assessed Transfers Transfers: Sit to Stand;Stand to Sit Sit to Stand: 5: Supervision;With upper extremity assist;With armrests;From chair/3-in-1 Stand to Sit: 5: Supervision;With upper extremity assist;With armrests;To chair/3-in-1 Details for Transfer Assistance: Cues for hand placement.  No unsteadiness noted but cont's to be slightly impulsive Ambulation/Gait Ambulation/Gait Assistance: 5: Supervision Ambulation Distance (Feet): 400 Feet Assistive device: None Ambulation/Gait Assistance Details: Pt steady.  Has difficulty following multi-step cues/challenges while ambulating.   Gait Pattern: Step-through pattern;Wide base of support Gait velocity: although not formally assessed appears WFL.   General Gait Details: Had pt perform multistep cues/challenges in which pt had difficulty following.    Stairs: Yes Stairs Assistance: 4: Min guard Stairs Assistance Details (indicate cue type and reason): Pt slightly impulsive & poor awareness of IV line but overall did well & steady.   Stair Management Technique: Two rails;Alternating pattern;Forwards Number of Stairs: 3 (2x's) Wheelchair Mobility Wheelchair Mobility: No      PT Goals Acute Rehab PT Goals PT Goal Formulation: With patient Time For Goal Achievement: 05/02/13 Potential to Achieve Goals: Good Pt will go Supine/Side to Sit: with supervision Pt will go Sit to Supine/Side: with supervision Pt will go Sit to Stand: with supervision PT Goal: Sit to Stand - Progress: Met Pt will Ambulate: >150 feet;with supervision;with least restrictive assistive device PT Goal: Ambulate - Progress: Progressing toward goal Pt will Go Up / Down Stairs: 3-5 stairs;with min assist PT Goal: Up/Down Stairs - Progress: Met  Visit Information  Last PT Received On: 04/30/13 Assistance Needed: +1    Subjective Data      Cognition  Cognition Arousal/Alertness: Awake/alert Behavior During Therapy: Flat affect Overall Cognitive Status: History of cognitive impairments - at baseline    Balance     End of Session PT - End of Session Equipment Utilized During Treatment: Gait belt Activity Tolerance: Patient tolerated treatment well Patient left: in chair;with call bell/phone within reach Nurse Communication: Mobility status     Verdell Face, [place] [phone] 04/30/2013

## 2013-05-03 ENCOUNTER — Encounter: Payer: Self-pay | Admitting: Neurology

## 2013-05-03 ENCOUNTER — Ambulatory Visit (INDEPENDENT_AMBULATORY_CARE_PROVIDER_SITE_OTHER): Payer: BC Managed Care – PPO | Admitting: Neurology

## 2013-05-03 VITALS — BP 107/73 | HR 84 | Ht 71.0 in | Wt 146.0 lb

## 2013-05-03 DIAGNOSIS — R569 Unspecified convulsions: Secondary | ICD-10-CM

## 2013-05-03 DIAGNOSIS — E0590 Thyrotoxicosis, unspecified without thyrotoxic crisis or storm: Secondary | ICD-10-CM | POA: Insufficient documentation

## 2013-05-03 DIAGNOSIS — R519 Headache, unspecified: Secondary | ICD-10-CM | POA: Insufficient documentation

## 2013-05-03 DIAGNOSIS — R51 Headache: Secondary | ICD-10-CM

## 2013-05-03 DIAGNOSIS — F0390 Unspecified dementia without behavioral disturbance: Secondary | ICD-10-CM

## 2013-05-03 NOTE — Progress Notes (Signed)
History: 52 years old right-handed Caucasian female, accompanied by her husband, referred by her primary care physician Dr. Blair Heys for evaluation of memory loss.  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  CAT scan without contrast February 2013 reviewed, there was marked generalized atrophy, more prominent at bilateral frontal temporal,  Laboratory evaluation normal TSH, CBC, CMP, vitamin B12, normal Copper and, ceruloplasmin level  Repeat lab showed evidence of markedly elevated thyroid globulin antibody, thyroid peroxidase antibody, otherwise negative HIV, RPR, B12, CMP, CBC, urine drug screen was negative, heavy metal screen was negative   MRI of the brain showed moderate to severe generalized atrophy,  UPDATE May 7th 2014:  She came in with her husband, and her mother-in-law at today's clinical visit, her mother-in-law is on her way from [place] to prevent she was admitted to the hospital in May first for 1 seizure,  her husband found her on the floor, complains of low back pain, because she fell backwards, bumped her back into the cabinet, she had a T7 fracture, wearing a rigid chest brace, she was put on Keppra 500 mg twice a day, CAT scan also demonstrated multiple  thyroid nodule, she is going to see of endocrinologists.  EEG showed diffuse severe slowing per report,  Husband also reported that they are moving back to [place], to get more social support.  PHYSICAL EXAMINATOINS:  Generalized: In no acute distress  Neck: Supple, no carotid bruits  Cardiac: Regular rate rhythm  Pulmonary: Clear to auscultation bilaterally  Musculoskeletal: wearing a rigid chest brace.  Neurological examination  Mentation: calm, following command, MMSE 11/30, she is not oriented to time, place, could not spell world backwards, missed 3/3 recalls, could not write sentence or copy design.  Cranial II-Xii: Pupils equal round reactive to light, extraoular movements were full, visual field were full on confrontational test. facial sensation and strength were normal. hearing was intact to finger rubbing bilaterally. Uvula tongue midline.  head turning and shoulder shrug and were normal and symmetric.Tongue protrusion into cheek strength was normal.  Motor: normal tone, bulk and strength.   Coordination: Normal finger to nose, heel-to-shin bilaterally there was no truncal ataxia  Gait: Rising up from seated position without assistance, mildly leaning forward, steady gait.  Romberg signs: Negative  Deep tendon reflexes: Brachioradialis 2/2, biceps 2/2, triceps 2/2, patellar 2/2, Achilles 2/2, plantar responses were flexor bilaterally.  Assessment and plan: 52 years old Caucasian female, presenting with 5 years history of rapid onset memory trouble, mainly at short-term memory, also visuospatial disorientation,comprehensive difficulty,   1 MRI of the brain showed generalized atrophy. Indicating a slow process, 2. However the elevated thyroglobulin antibody, thyroid peroxidase antibody could be indicating a potential steroid responsive immunoencephalopathy such as Hashimoto's encephalopathy  3. I will hold off steroid treatment because her planning  on moving to  [facility] clinic, and also recent T7 compression fracture 4, return to clinic in one month

## 2013-05-10 ENCOUNTER — Telehealth: Payer: Self-pay | Admitting: Neurology

## 2013-05-17 ENCOUNTER — Telehealth: Payer: Self-pay | Admitting: Neurology

## 2013-05-18 LAB — FUNGUS CULTURE W SMEAR: Smear Result: NONE SEEN

## 2013-05-30 ENCOUNTER — Encounter: Payer: Self-pay | Admitting: Neurology

## 2013-05-30 ENCOUNTER — Ambulatory Visit: Payer: BC Managed Care – PPO | Admitting: Neurology

## 2013-05-30 ENCOUNTER — Ambulatory Visit (INDEPENDENT_AMBULATORY_CARE_PROVIDER_SITE_OTHER): Payer: BC Managed Care – PPO | Admitting: Neurology

## 2013-05-30 VITALS — Ht 71.0 in | Wt 144.0 lb

## 2013-05-30 DIAGNOSIS — G9340 Encephalopathy, unspecified: Secondary | ICD-10-CM

## 2013-05-30 DIAGNOSIS — E063 Autoimmune thyroiditis: Secondary | ICD-10-CM | POA: Insufficient documentation

## 2013-05-30 DIAGNOSIS — R413 Other amnesia: Secondary | ICD-10-CM

## 2013-05-30 MED ORDER — LEVETIRACETAM 500 MG PO TABS: 500.0000 mg | ORAL_TABLET | Freq: Two times a day (BID) | ORAL | Status: DC

## 2013-05-30 NOTE — Progress Notes (Signed)
History: 52 years old right-handed Caucasian female, accompanied by her husband, referred by her primary care physician Dr. Blair Heys for evaluation of memory loss.  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  CAT scan without contrast February 2013 reviewed, there was marked generalized atrophy, more prominent at bilateral frontal temporal,  Laboratory evaluation normal TSH, CBC, CMP, vitamin B12, normal Copper and, ceruloplasmin level  Repeat lab showed evidence of markedly elevated thyroid globulin antibody, thyroid peroxidase antibody, otherwise negative HIV, RPR, B12, CMP, CBC, urine drug screen was negative, heavy metal screen was negative   MRI of the brain showed moderate to severe generalized atrophy,  I saw her for followup with her husband, and her mother-in-law in May 03 2013, she was admitted to the hospital in May first for 1 seizure,  her husband found her on the floor, complains of low back pain, because she fell backwards, bumped her back into the cabinet, she had a T7 fracture, wearing a rigid chest brace, she was put on Keppra 500 mg twice a day, CAT scan also demonstrated multiple thyroid nodule, she is going to see of endocrinologists.  EEG  showed diffuse severe slowing per report,  Husband also reported that they are moving back to [place], to get more social support during the visit in May, for that reason, and also because of recent T7 compression fracture, we decided to hold off treatment, such as IV steroid,  She was evaluated by [facility] Dr. 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, also suggested IV steroid worse his IVIG treatment,  Husband stated today, that they are leaving in 2 days, to [facility] clinic, will come back in August, I have spent a lengthy time discussing with him the treatment options, he decided to go over potential side effect, likely initiate treatment at [facility], continue to follow up with our clinic    PHYSICAL EXAMINATOINS:  Generalized: In no acute distress  Neck: Supple, no carotid bruits  Cardiac: Regular rate rhythm  Pulmonary: Clear to auscultation bilaterally  Musculoskeletal: wearing a rigid chest brace.  Neurological examination  Mentation: calm, following command, MMSE 15/30, she is not oriented to time, place, could not spell world backwards, missed 3/3 recalls, could not write sentence or copy design. She has right-to-left confusion, finger agnosia, acalculia, has difficulty following three-step commands, flat face, decreased facial expression,  Cranial II-Xii: Pupils equal round reactive to light, extraoular movements were full, visual field were full on confrontational test. facial sensation and strength were normal. hearing was intact to finger rubbing bilaterally. Uvula tongue midline.  head turning and shoulder shrug and were normal and symmetric.Tongue protrusion into cheek strength was normal.  Motor: normal tone, bulk and strength.   Coordination: Normal finger to nose, heel-to-shin bilaterally  there was no truncal ataxia  Gait: Rising up from seated position without assistance, mildly leaning forward, steady gait.  Romberg  signs: Negative  Deep tendon reflexes: Brachioradialis 2/2, biceps 2/2, triceps 2/2, patellar 2/2, Achilles 2/2, plantar responses were flexor bilaterally.  Assessment and plan: 52 years old Caucasian female, presenting with 5 years history of rapid decline memory trouble, mainly at short-term memory, also visuospatial disorientation,comprehensive difficulty,   1 MRI of the brain showed generalized atrophy. Indicating a slow process, 2. However the elevated thyroglobulin antibody, thyroid peroxidase antibody could be indicating a potential steroid responsive immunoencephalopathy such as Hashimoto's encephalopathy  3. After spent a lengthy time discussed with patient and her husband,  We decided to hold off treatment, will her to [facility] clinic Dr. Elenor Legato. 4. RTC for follow up

## 2013-05-31 ENCOUNTER — Other Ambulatory Visit: Payer: Self-pay

## 2013-05-31 MED ORDER — LEVETIRACETAM 500 MG PO TABS: 500.0000 mg | ORAL_TABLET | Freq: Two times a day (BID) | ORAL | Status: DC

## 2013-05-31 NOTE — Telephone Encounter (Signed)
Patients spouse called front desk saying pharmacy does not have Rx.  I have resent it.

## 2013-09-19 ENCOUNTER — Encounter (HOSPITAL_COMMUNITY): Payer: Self-pay | Admitting: Emergency Medicine

## 2013-09-19 ENCOUNTER — Emergency Department (HOSPITAL_COMMUNITY)
Admission: EM | Admit: 2013-09-19 | Discharge: 2013-09-19 | Disposition: A | Discharge status: Home / Self Care | Payer: BC Managed Care – PPO | Attending: Emergency Medicine | Admitting: Emergency Medicine

## 2013-09-19 ENCOUNTER — Emergency Department (HOSPITAL_COMMUNITY): Payer: BC Managed Care – PPO

## 2013-09-19 DIAGNOSIS — Z87891 Personal history of nicotine dependence: Secondary | ICD-10-CM | POA: Insufficient documentation

## 2013-09-19 DIAGNOSIS — Z79899 Other long term (current) drug therapy: Secondary | ICD-10-CM | POA: Insufficient documentation

## 2013-09-19 DIAGNOSIS — Z8781 Personal history of (healed) traumatic fracture: Secondary | ICD-10-CM | POA: Insufficient documentation

## 2013-09-19 DIAGNOSIS — Z8659 Personal history of other mental and behavioral disorders: Secondary | ICD-10-CM | POA: Insufficient documentation

## 2013-09-19 DIAGNOSIS — G40909 Epilepsy, unspecified, not intractable, without status epilepticus: Secondary | ICD-10-CM | POA: Insufficient documentation

## 2013-09-19 DIAGNOSIS — Z8639 Personal history of other endocrine, nutritional and metabolic disease: Secondary | ICD-10-CM | POA: Insufficient documentation

## 2013-09-19 DIAGNOSIS — R569 Unspecified convulsions: Secondary | ICD-10-CM

## 2013-09-19 DIAGNOSIS — Z862 Personal history of diseases of the blood and blood-forming organs and certain disorders involving the immune mechanism: Secondary | ICD-10-CM | POA: Insufficient documentation

## 2013-09-19 HISTORY — DX: Wedge compression fracture of unspecified thoracic vertebra, initial encounter for closed fracture: S22.000A

## 2013-09-19 MED ORDER — SODIUM CHLORIDE 0.9 % IV SOLN
1000.0000 mg | Freq: Once | INTRAVENOUS | Status: AC
  Administered 2013-09-19: 1000 mg via INTRAVENOUS
  Filled 2013-09-19: qty 10

## 2013-09-19 NOTE — ED Provider Notes (Signed)
CSN: [id number]     Arrival date & time 09/19/13  1308 History   First MD Initiated Contact with Patient 09/19/13 [phone]     Chief Complaint  Patient presents with  . Seizures   (Consider location/radiation/quality/duration/timing/severity/associated sxs/prior Treatment) Patient is a 52 y.o. female presenting with seizures.  Seizures Seizure activity on arrival: no   Seizure type:  Grand mal Initial focality:  None Episode characteristics: generalized shaking, stiffening and unresponsiveness   Postictal symptoms: confusion, memory loss and somnolence   Severity:  Severe Duration:  1 minute Timing:  Once Number of seizures this episode:  1 Progression:  Resolved Context: change in medication (Keppra discontinued yesterday.)   Context: not fever   PTA treatment:  None History of seizures: yes     Past Medical History  Diagnosis Date  . Memory loss   . Hyperthyroidism   . Dementia   . Seizures   . Headache(784.0)   . Compression fracture of thoracic vertebra    Past Surgical History  Procedure Laterality Date  . Uterine ablation     Family History  Problem Relation Age of Onset  . Memory loss Mother   . Stroke Mother    History  Substance Use Topics  . Smoking status: Former Smoker -- 0.50 packs/day    Types: Cigarettes  . Smokeless tobacco: Never Used     Comment: smokes five cigarettes daily.  . Alcohol Use: No   OB History   Grav Para Term Preterm Abortions TAB SAB Ect Mult Living                 Review of Systems  Constitutional: Negative for fever.  HENT: Negative for congestion.   Respiratory: Negative for cough and shortness of breath.   Cardiovascular: Negative for chest pain.  Gastrointestinal: Negative for nausea, vomiting, abdominal pain and diarrhea.  Neurological: Positive for seizures.  All other systems reviewed and are negative.    Allergies  Review of patient's allergies indicates no known allergies.  Home Medications   Current  Outpatient Rx  Name  Route  Sig  Dispense  Refill  . fish oil-omega-3 fatty acids 1000 MG capsule   Oral   Take 2 g by mouth daily.          Marland Kitchen levETIRAcetam (KEPPRA) 500 MG tablet   Oral   Take 1 tablet (500 mg total) by mouth 2 (two) times daily.   60 tablet   12    BP 115/76  Temp(Src) 97.7 F (36.5 C) (Oral)  Resp 17  SpO2 99% Physical Exam  Nursing note and vitals reviewed. Constitutional: She appears well-developed and well-nourished. No distress.  HENT:  Head: Normocephalic and atraumatic.  Mouth/Throat: Oropharynx is clear and moist.  Eyes: Conjunctivae are normal. Pupils are equal, round, and reactive to light. No scleral icterus.  Neck: Neck supple.  Cardiovascular: Normal rate, regular rhythm, normal heart sounds and intact distal pulses.   No murmur heard. Pulmonary/Chest: Effort normal and breath sounds normal. No stridor. No respiratory distress. She has no rales.  Abdominal: Soft. Bowel sounds are normal. She exhibits no distension. There is no tenderness.  Musculoskeletal: Normal range of motion.  Neurological: She is disoriented.  Confused   Skin: Skin is warm and dry. No rash noted.  Psychiatric: She has a normal mood and affect. Her behavior is normal.    ED Course  Procedures (including critical care time) Labs Review Labs Reviewed - No data to display Imaging Review Ct Head  Wo Contrast  09/19/2013   CLINICAL DATA:  52 year old female status post seizure or syncope and fall. Unresponsive.  EXAM: CT HEAD WITHOUT CONTRAST  CT CERVICAL SPINE WITHOUT CONTRAST  TECHNIQUE: Multidetector CT imaging of the head and cervical spine was performed following the standard protocol without intravenous contrast. Multiplanar CT image reconstructions of the cervical spine were also generated.  COMPARISON:  04/28/2013 and earlier.  FINDINGS: CT HEAD FINDINGS  Visualized paranasal sinuses and mastoids are clear. No scalp hematoma identified. Visualized orbit soft tissues  are within normal limits. Calvarium intact.  Stable cerebral volume, advanced for age. No ventriculomegaly. No midline shift, mass effect, or evidence of intracranial mass lesion. No acute intracranial hemorrhage identified. No evidence of cortically based acute infarction identified. Stable gray-white matter differentiation throughout the brain. No suspicious intracranial vascular hyperdensity.  CT CERVICAL SPINE FINDINGS  Mild motion artifact, most apparent at the C2 body level. Stable cervical vertebral height and alignment. Cervicothoracic junction alignment is within normal limits. Bilateral posterior element alignment is within normal limits. Visualized skull base is intact. No atlanto-occipital dissociation. . No acute cervical spine fracture identified. Stable visualized paraspinal soft tissues including heterogeneity of the thyroid.  IMPRESSION: CT HEAD IMPRESSION  No acute traumatic injury identified. Stable non contrast CT appearance of the brain, with age advanced volume loss.  CT CERVICAL SPINE IMPRESSION  No acute fracture or listhesis identified in the cervical spine. Ligamentous injury is not excluded.   Electronically Signed   By: Augusto Gamble M.D.   On: 09/19/2013 10:39   Ct Cervical Spine Wo Contrast  09/19/2013   CLINICAL DATA:  52 year old female status post seizure or syncope and fall. Unresponsive.  EXAM: CT HEAD WITHOUT CONTRAST  CT CERVICAL SPINE WITHOUT CONTRAST  TECHNIQUE: Multidetector CT imaging of the head and cervical spine was performed following the standard protocol without intravenous contrast. Multiplanar CT image reconstructions of the cervical spine were also generated.  COMPARISON:  04/28/2013 and earlier.  FINDINGS: CT HEAD FINDINGS  Visualized paranasal sinuses and mastoids are clear. No scalp hematoma identified. Visualized orbit soft tissues are within normal limits. Calvarium intact.  Stable cerebral volume, advanced for age. No ventriculomegaly. No midline shift, mass  effect, or evidence of intracranial mass lesion. No acute intracranial hemorrhage identified. No evidence of cortically based acute infarction identified. Stable gray-white matter differentiation throughout the brain. No suspicious intracranial vascular hyperdensity.  CT CERVICAL SPINE FINDINGS  Mild motion artifact, most apparent at the C2 body level. Stable cervical vertebral height and alignment. Cervicothoracic junction alignment is within normal limits. Bilateral posterior element alignment is within normal limits. Visualized skull base is intact. No atlanto-occipital dissociation. . No acute cervical spine fracture identified. Stable visualized paraspinal soft tissues including heterogeneity of the thyroid.  IMPRESSION: CT HEAD IMPRESSION  No acute traumatic injury identified. Stable non contrast CT appearance of the brain, with age advanced volume loss.  CT CERVICAL SPINE IMPRESSION  No acute fracture or listhesis identified in the cervical spine. Ligamentous injury is not excluded.   Electronically Signed   By: Augusto Gamble M.D.   On: 09/19/2013 10:39  All radiology studies independently viewed by me.     EKG - NSR, rate 83, normal axis, normal intervlas, no ST/T changes, similar to prior.   MDM   1. Seizure    52 year old female with a history of seizure disorder and undiagnosed neurologic disorder including memory loss presenting after an apparent seizure. Witnessed by her husband. Described as Generalized shaking for approximately  1 minute followed by a period of unresponsiveness and confusion. She has had a significant cognitive decline over the past 5 months and has been evaluated by multiple neurologists. She was discontinued on her Keppra yesterday by her neurologist in [place]. During her seizure she fell back and hit the back of her head. She does not seem to have sustained significant injuries, but head CT and cervical spine CT are pending. Will reload with Keppra.  12:24 PM Head and C  spine CT negative.  Pt is back to her baseline according to her husband.  Ambulated.  Husband has coordinated neurologic care for her at the [facility].  She will follow up there and with her neurologist in [place].  I think it is appropriate for her to restart her Keppra in light of today's seizure.        Candyce Churn, MD 09/19/13 1229

## 2013-09-19 NOTE — ED Notes (Signed)
Received pt from home via EMS with c/o while sitting at the table pt became tense and fell to floor. Upon arrival of EMS pt postical. Pt has history of seizure and was told to stop her Keppra 2-3 days ago by her Dr. Rock Nephew remains postictal. Pt orient to name only. Pt able to follow commands. Pt was placed on KED and c-collar by EMS as precaution.

## 2013-09-19 NOTE — ED Notes (Signed)
Pt successfully ambulated with assistance, and without difficulty.

## 2013-10-12 ENCOUNTER — Other Ambulatory Visit: Payer: Self-pay | Admitting: *Deleted

## 2013-10-12 ENCOUNTER — Other Ambulatory Visit (INDEPENDENT_AMBULATORY_CARE_PROVIDER_SITE_OTHER): Payer: BC Managed Care – PPO

## 2013-10-12 DIAGNOSIS — E0590 Thyrotoxicosis, unspecified without thyrotoxic crisis or storm: Secondary | ICD-10-CM

## 2013-10-12 LAB — T3, FREE: T3, Free: 2.7 pg/mL (ref 2.3–4.2)

## 2013-10-12 LAB — T4, FREE: Free T4: 0.92 ng/dL (ref 0.60–1.60)

## 2013-10-12 LAB — TSH: TSH: 1.57 u[IU]/mL (ref 0.35–5.50)

## 2013-10-17 ENCOUNTER — Ambulatory Visit (INDEPENDENT_AMBULATORY_CARE_PROVIDER_SITE_OTHER): Payer: BC Managed Care – PPO | Admitting: Endocrinology

## 2013-10-17 ENCOUNTER — Encounter: Payer: Self-pay | Admitting: Endocrinology

## 2013-10-17 VITALS — BP 126/88 | HR 73 | Temp 98.3°F | Resp 12 | Ht 71.0 in | Wt 159.9 lb

## 2013-10-17 DIAGNOSIS — E063 Autoimmune thyroiditis: Secondary | ICD-10-CM | POA: Insufficient documentation

## 2013-10-17 NOTE — Progress Notes (Signed)
  Reason for Appointment:  Hashimoto thyroiditis, followup visit    History of Present Illness:   The patient was found to have Hashimoto's thyroiditis when being evaluated for encephalopathy which was associated with positive TPO antibodies Detailed information is not available from previous records at this time Patient has not had any hypothyroidism or goiter She has had a thyroid ultrasound which showed subcentimeter nodules only and heterogenous texture of the thyroid She is here for periodic followup  Appointment on 10/12/2013  Component Date Value Range Status  . Free T4 10/12/2013 0.92  0.60 - 1.60 ng/dL Final  . TSH 16/09/9603 1.57  0.35 - 5.50 uIU/mL Final  . T3, Free 10/12/2013 2.7  2.3 - 4.2 pg/mL Final      Medication List       This list is accurate as of: 10/17/13 10:10 AM.  Always use your most recent med list.               fish oil-omega-3 fatty acids 1000 MG capsule  Take 2 g by mouth daily.     levETIRAcetam 500 MG tablet  Commonly known as:  KEPPRA  Take 1 tablet (500 mg total) by mouth 2 (two) times daily.     mirtazapine 15 MG disintegrating tablet  Commonly known as:  REMERON SOL-TAB        Allergies: No Known Allergies  Past Medical History  Diagnosis Date  . Memory loss   . Hyperthyroidism   . Dementia   . Seizures   . Headache(784.0)   . Compression fracture of thoracic vertebra     Past Surgical History  Procedure Laterality Date  . Uterine ablation      Family History  Problem Relation Age of Onset  . Memory loss Mother   . Stroke Mother     Social History:  reports that she has quit smoking. Her smoking use included Cigarettes. She smoked 0.50 packs per day. She has never used smokeless tobacco. She reports that she does not drink alcohol or use illicit drugs.  REVIEW Of SYSTEMS:  She has had seizures and dementia from her Hashimoto's encephalopathy Has had some stabilization of her condition and is more functional Has  had treatment with high-dose steroids without significant benefit at a tertiary clinic and her husband is planning to take her to [facility]   Examination:   BP 126/88  Pulse 73  Temp(Src) 98.3 F (36.8 C)  Resp 12  Ht 5\' 11"  (1.803 m)  Wt 159 lb 14.4 oz (72.53 kg)  BMI 22.31 kg/m2  SpO2 95%   GENERAL APPEARANCE: Alert And looks well.    FACE: No puffiness of face or periorbital edema.         NECK:  right lobe of thyroid is just palpable and relatively soft         NEUROLOGIC EXAM: DTRs 2+ bilaterally at biceps.    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 One may consider thyroid supplementation if her TSH is trending above 3.0 or if she develops an increase in size of her thyroid   Cheyenne Tyler 10/17/2013, 10:10 AM

## 2014-02-17 NOTE — Telephone Encounter (Signed)
Closing encounter

## 2014-04-12 ENCOUNTER — Other Ambulatory Visit (INDEPENDENT_AMBULATORY_CARE_PROVIDER_SITE_OTHER): Payer: BC Managed Care – PPO

## 2014-04-12 DIAGNOSIS — E063 Autoimmune thyroiditis: Secondary | ICD-10-CM

## 2014-04-12 LAB — TSH: TSH: 2.45 u[IU]/mL (ref 0.35–5.50)

## 2014-04-12 LAB — T4, FREE: Free T4: 0.91 ng/dL (ref 0.60–1.60)

## 2014-04-13 ENCOUNTER — Telehealth: Payer: Self-pay | Admitting: *Deleted

## 2014-04-13 ENCOUNTER — Telehealth: Payer: Self-pay | Admitting: Endocrinology

## 2014-04-13 LAB — THYROID PEROXIDASE ANTIBODY: Thyroperoxidase Ab SerPl-aCnc: 9818 IU/mL — ABNORMAL HIGH (ref <35.0)

## 2014-04-13 NOTE — Telephone Encounter (Signed)
Patient's husband said [place] would probably be moving to [place] in July and he plans on having his [facility], he would like to get a referral there from you.   He did reschedule her appointment until June 17th.

## 2014-04-13 NOTE — Telephone Encounter (Signed)
Pt's husband would like for us to give him the pt's lab results before the appt because if everything is fine he needs to cancel the appt for her for 4/20 due to insurance deductible

## 2014-04-13 NOTE — Telephone Encounter (Signed)
Please see below and advise.

## 2014-04-13 NOTE — Telephone Encounter (Signed)
He needs to call the endocrinology department, any physician would be fine

## 2014-04-17 ENCOUNTER — Ambulatory Visit: Payer: BC Managed Care – PPO | Admitting: Endocrinology

## 2014-06-12 ENCOUNTER — Other Ambulatory Visit: Payer: Self-pay | Admitting: Neurology

## 2014-06-14 ENCOUNTER — Ambulatory Visit (INDEPENDENT_AMBULATORY_CARE_PROVIDER_SITE_OTHER): Payer: BC Managed Care – PPO | Admitting: Endocrinology

## 2014-06-14 ENCOUNTER — Encounter: Payer: Self-pay | Admitting: Endocrinology

## 2014-06-14 VITALS — BP 116/82 | HR 78 | Temp 98.2°F | Resp 14 | Ht 71.0 in | Wt 186.0 lb

## 2014-06-14 DIAGNOSIS — E063 Autoimmune thyroiditis: Secondary | ICD-10-CM

## 2014-06-14 LAB — TSH: TSH: 0.68 u[IU]/mL (ref 0.35–4.50)

## 2014-06-14 NOTE — Progress Notes (Signed)
Cheyenne Tyler 53 y.o.   Reason for Appointment:  Hashimoto thyroiditis, followup visit    History of Present Illness:   The patient was found to have Hashimoto's thyroiditis when being evaluated for encephalopathy which was associated with positive TPO antibodies. Initially the antibody titer was about 550  Patient has not had any hypothyroidism or significant goiter She has had a thyroid ultrasound which showed subcentimeter nodules only and heterogenous texture of the thyroid She is here for periodic followup   Her main difficulty recently is with progressive weight gain of unclear etiology. She is not feeling more tired than usual or having any more functional difficulties. Does tend to get cold easily Has some shedding of her hair but no loss of scalp hair  Lab Results  Component Value Date   FREET4 0.91 04/12/2014   FREET4 0.92 10/12/2013   FREET4 1.65 03/17/2013   TSH 2.45 04/12/2014   TSH 1.57 10/12/2013   TSH 0.774 04/28/2013     No visits with results within 1 Week(s) from this visit. Latest known visit with results is:  Appointment on 04/12/2014  Component Date Value Ref Range Status  . Free T4 04/12/2014 0.91  0.60 - 1.60 ng/dL Final  . TSH 96/04/540904/15/2015 2.45  0.35 - 5.50 uIU/mL Final  . Thyroid Peroxidase Antibody 04/12/2014 9818.0* <35.0 IU/mL Final   Comment: Result confirmed by manual dilution                                                     The thyroid microsomal antigen has been shown to be Thyroid                          Peroxidase (TPO).  This assay detects anti-TPO antibodies.      Medication List       This list is accurate as of: 06/14/14  2:00 PM.  Always use your most recent med list.               COCONUT OIL PO  Take by mouth.     fish oil-omega-3 fatty acids 1000 MG capsule  Take 2 g by mouth daily.     levETIRAcetam 500 MG tablet  Commonly known as:  KEPPRA  TAKE 1 TABLET BY MOUTH TWICE A DAY     mirtazapine 15 MG disintegrating  tablet  Commonly known as:  REMERON SOL-TAB     Vitamin D3 1000 UNITS Caps  Take by mouth.        Allergies: No Known Allergies  Past Medical History  Diagnosis Date  . Memory loss   . Hyperthyroidism   . Dementia   . Seizures   . Headache(784.0)   . Compression fracture of thoracic vertebra     Past Surgical History  Procedure Laterality Date  . Uterine ablation      Family History  Problem Relation Age of Onset  . Memory loss Mother   . Stroke Mother     Social History:  reports that she has quit smoking. Her smoking use included Cigarettes. She smoked 0.50 packs per day. She has never used smokeless tobacco. She reports that she does not drink alcohol or use illicit drugs.  REVIEW Of SYSTEMS:  She has had seizures and dementia from her Hashimoto's encephalopathy Has had some  stabilization of her condition and is more functional Has had treatment with high-dose steroids without significant benefit at a tertiary clinic and her [facility] is planning to [facility]   Examination:   BP 116/82  Pulse 78  Temp(Src) 98.2 F (36.8 C)  Resp 14  Ht 5\' 11"  (1.803 m)  Wt 186 lb (84.369 kg)  BMI 25.95 kg/m2  SpO2 94%   GENERAL APPEARANCE: Alert And looks well.    FACE: No puffiness of face or periorbital edema.         NECK:  right lobe of thyroid is just palpable and relatively firm         NEUROLOGIC EXAM: DTRs 2+ bilaterally at biceps. 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

## 2014-06-14 NOTE — Progress Notes (Signed)
Quick Note:  Please let patient know that the lab result is normal and no further action needed ______ 

## 2014-06-16 ENCOUNTER — Telehealth: Payer: Self-pay | Admitting: Endocrinology

## 2014-06-16 ENCOUNTER — Other Ambulatory Visit: Payer: Self-pay | Admitting: Endocrinology

## 2014-06-16 DIAGNOSIS — G9349 Other encephalopathy: Principal | ICD-10-CM

## 2014-06-16 DIAGNOSIS — E063 Autoimmune thyroiditis: Secondary | ICD-10-CM

## 2014-06-16 NOTE — Telephone Encounter (Signed)
Referral done to [facility], please confirm that it needs to go to Dr. Loleta ChanceHill

## 2014-06-16 NOTE — Telephone Encounter (Signed)
Please see below, I called the office and they need patients demographics and referral page to try and get them in before August.  Please advise

## 2014-06-16 NOTE — Telephone Encounter (Signed)
pts husband would like for us to send a referral to [facility] they gave him an August appt for his wife and he thinks if we get a referral it would not be so far out

## 2014-09-22 IMAGING — CT CT HEAD W/O CM
4 of 6 series · 17 of 47 positions shown, 19 images · non-contrast
Comparison: February 13, 2012

CLINICAL DATA: Loss of consciousness and trauma; question seizure

CT HEAD WITHOUT CONTRAST
TECHNIQUE: Contiguous axial images were obtained from the base of
the skull through the vertex without contrast.

[Series 3: recon 2: brain · axial · 0.47mm/px · z∈[-142,-57]mm · 5 of 56 slices shown]
[im 8/56  brain]
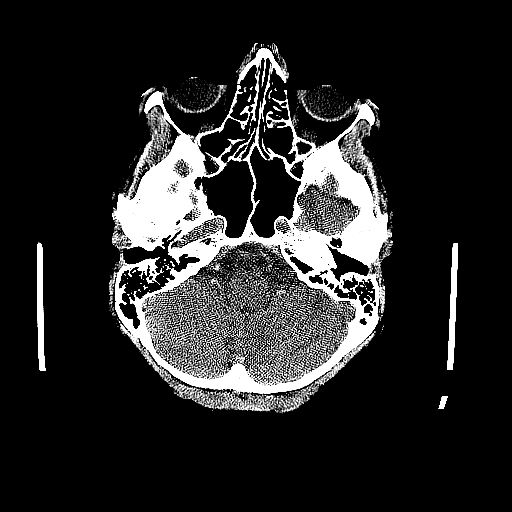
[im 16/56  brain]
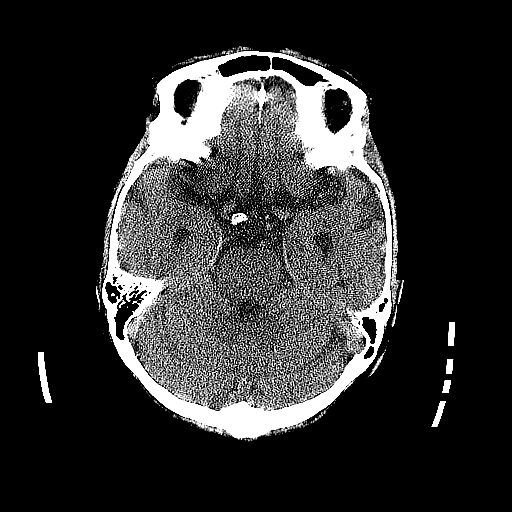
[im 24/56  brain]
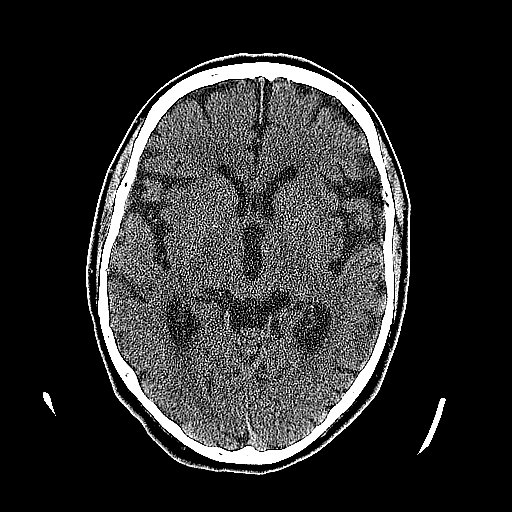
[im 32/56  brain]
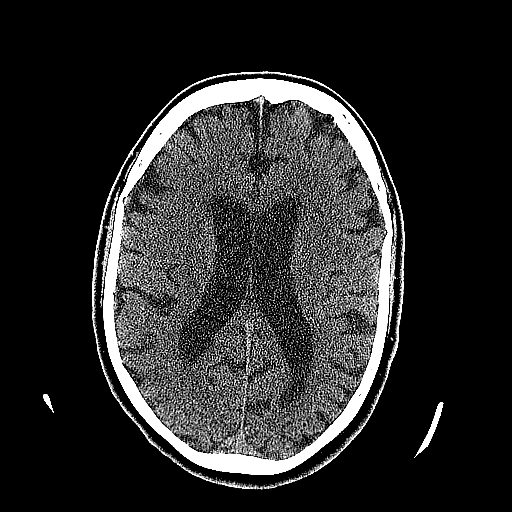
[im 40/56  brain]
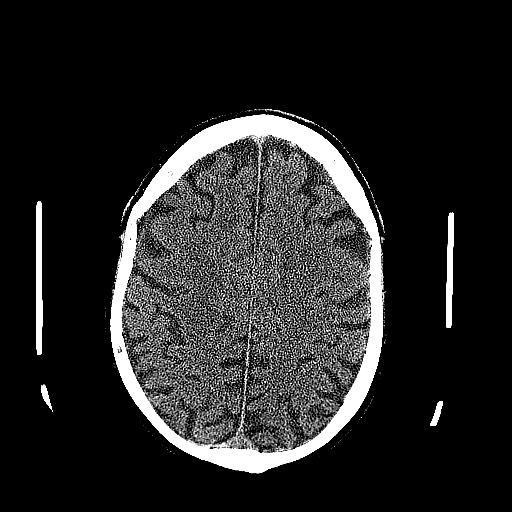

[Series 600: sagittals · sagittal · 0.43mm/px · 3 of 32 slices shown]
[im 11/32  brain]
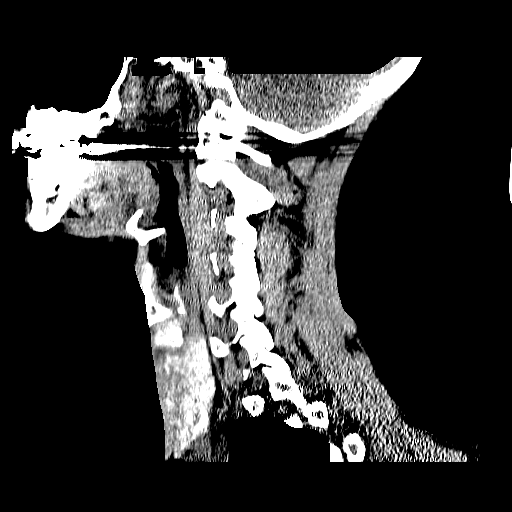
[im 16/32  brain]
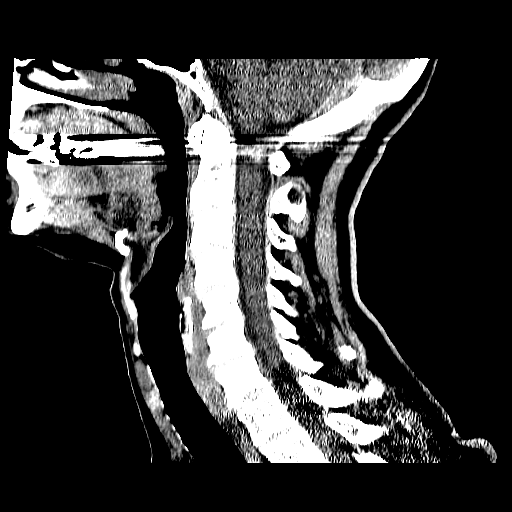
[im 21/32  brain]
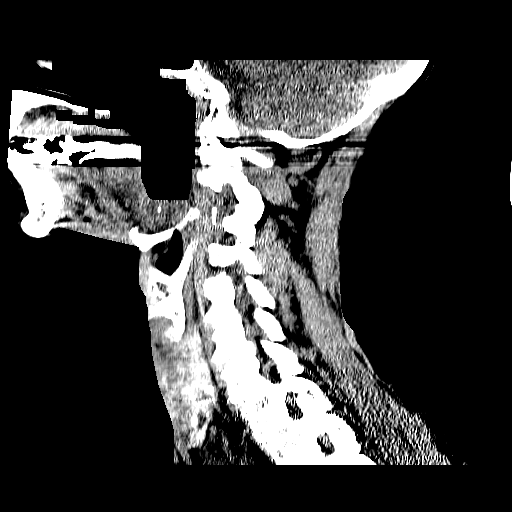

[Series 601: orthogs · axial · 0.33mm/px · z∈[-346,-235]mm · 6 of 54 slices shown, 8 images]
[im 8/54  brain]
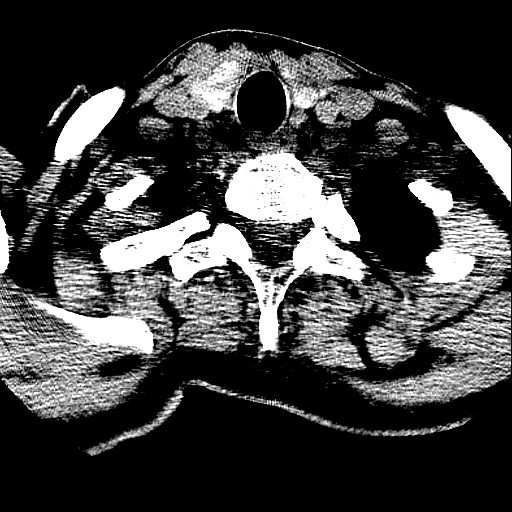
[im 8/54  bone]
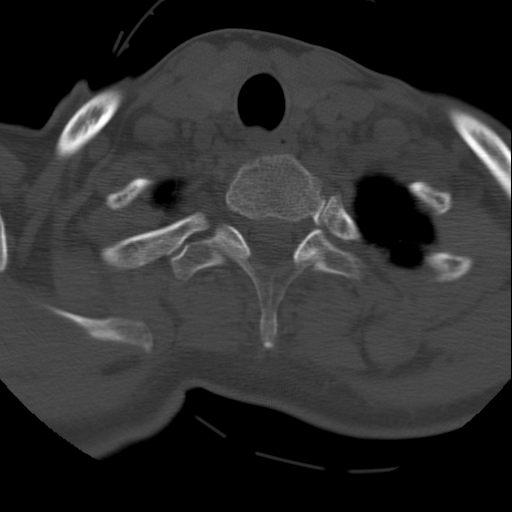
[im 16/54  brain]
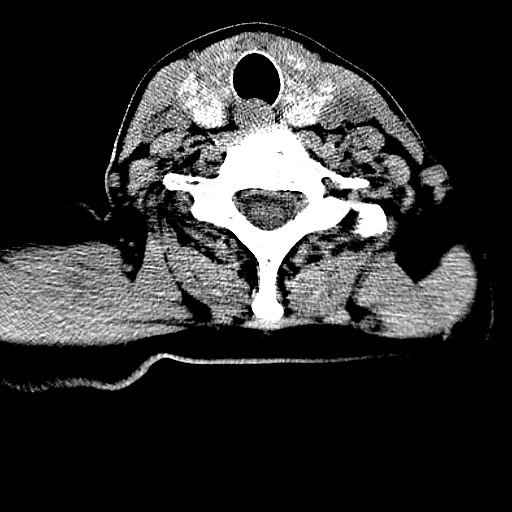
[im 23/54  brain]
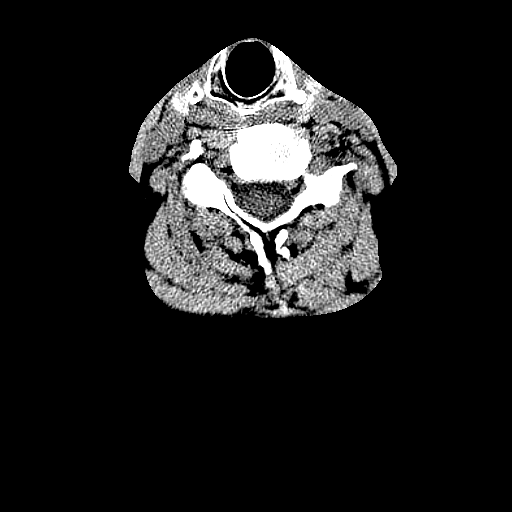
[im 31/54  brain]
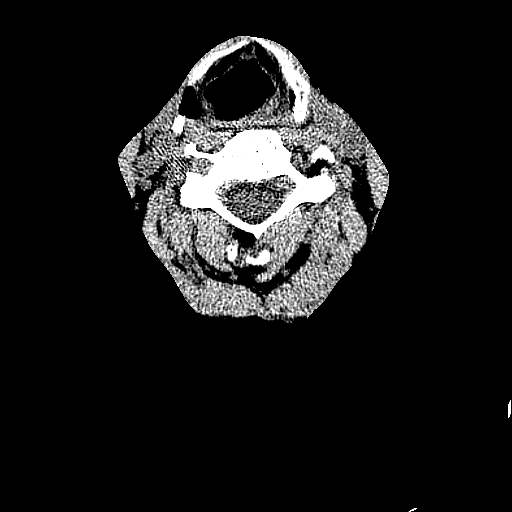
[im 38/54  brain]
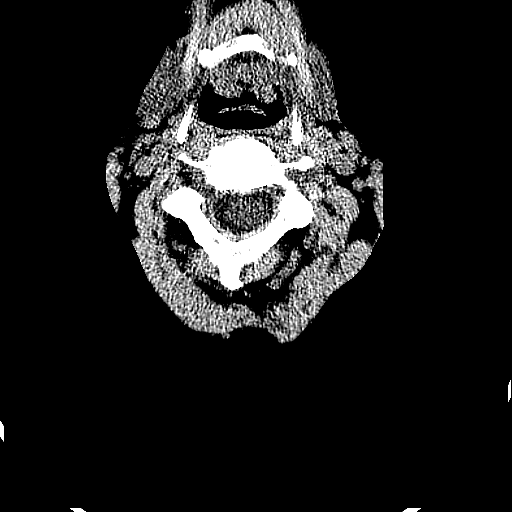
[im 38/54  bone]
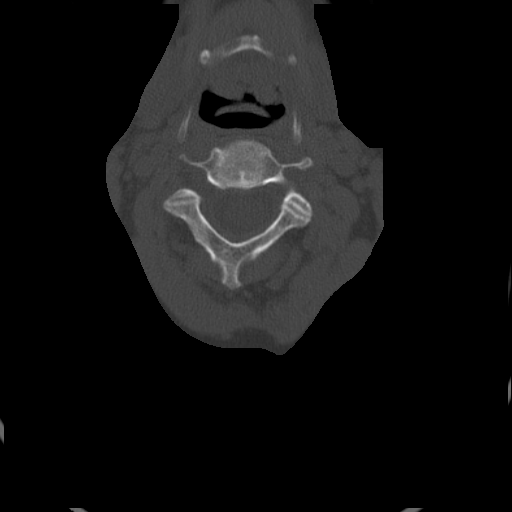
[im 46/54  brain]
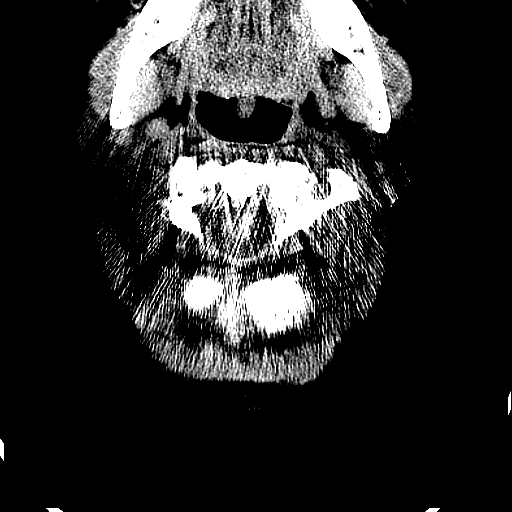

[Series 602: coronals · coronal · 0.43mm/px · 3 of 35 slices shown]
[im 12/35  brain]
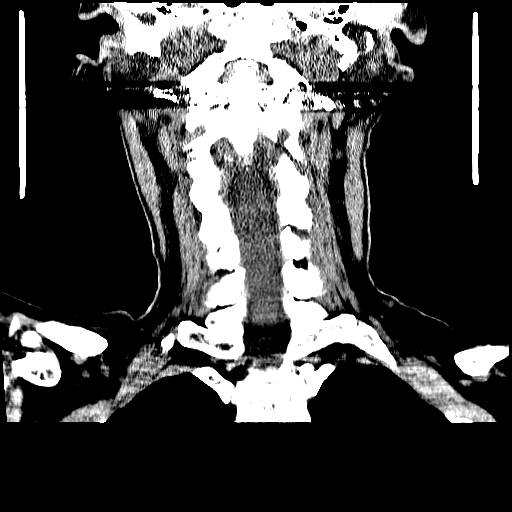
[im 16/35  brain]
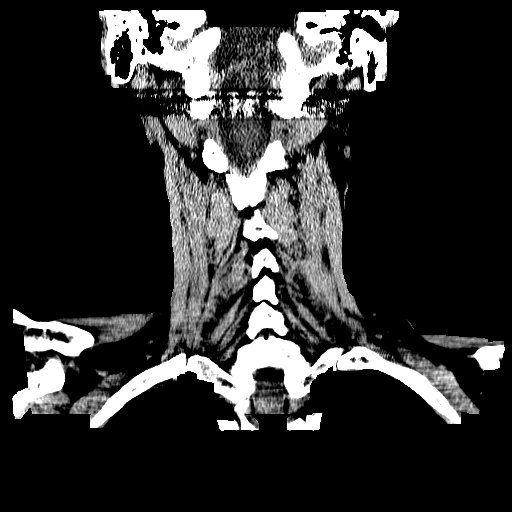
[im 19/35  brain]
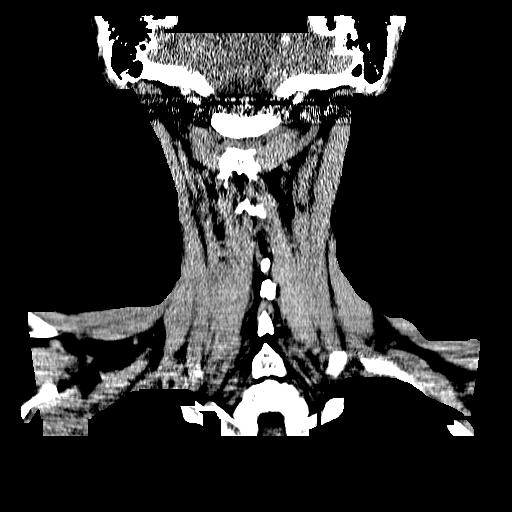

[17 of 47 positions shown; findings below may reference images not displayed]

FINDINGS: There is mild diffuse atrophy which is stable.  There is
no mass, hemorrhage, extra-axial fluid collection, or midline
shift.  Minimal periventricular small vessel disease in the centra
semiovale bilaterally is stable.  There is no acute infarct
apparent.  There is no new gray-white compartment lesions.

Bony calvarium appears intact.  The mastoid air cells are clear.
IMPRESSION: Mild diffuse atrophy with minimal small vessel disease, stable.  No
mass, hemorrhage, or acute appearing infarct.

## 2014-09-22 IMAGING — CR DG THORACIC SPINE 3V
3 series · 3 of 3 positions shown · non-contrast
Comparison: None.

CLINICAL DATA: Pain, fall, seizure

THORACIC SPINE - 2 VIEW + SWIMMERS

[t t-spine a.p.]
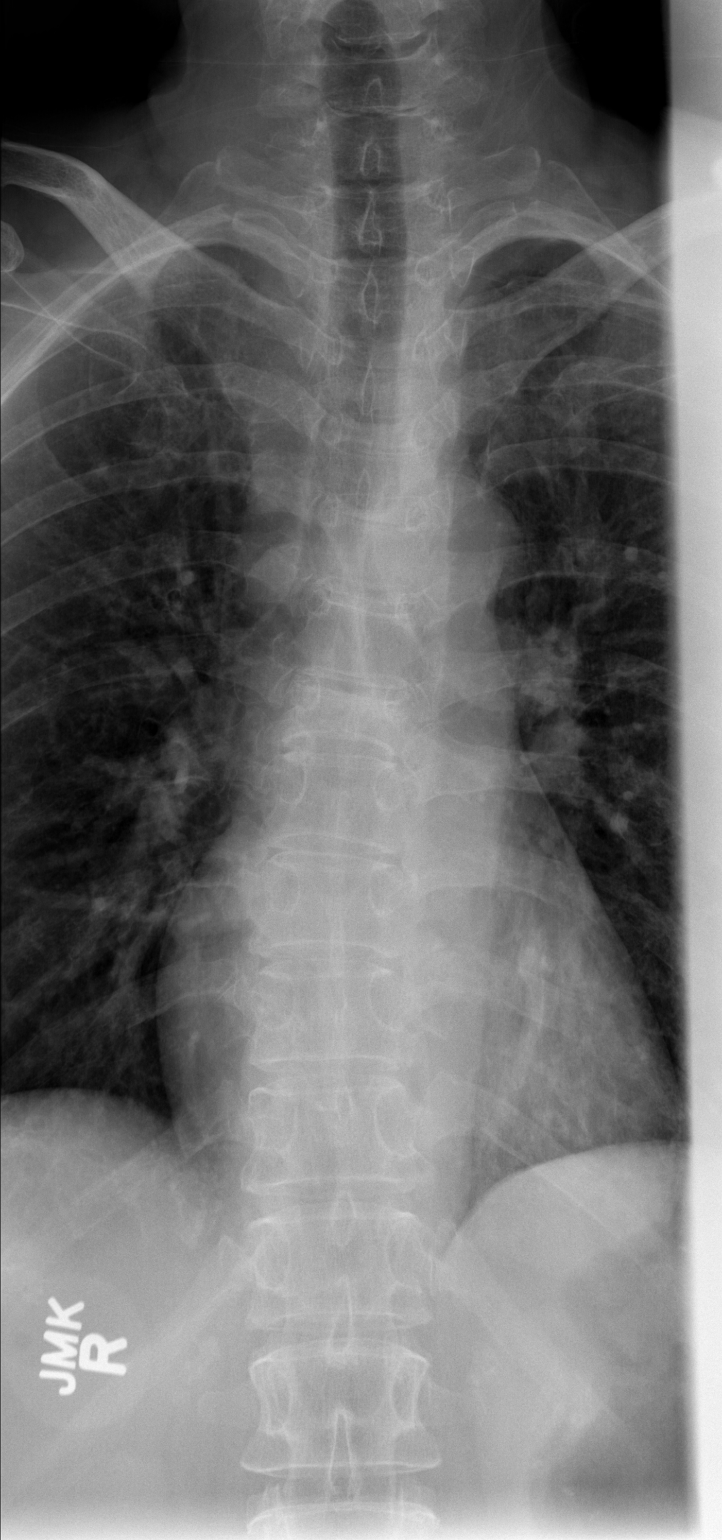

[t t-spine lat]
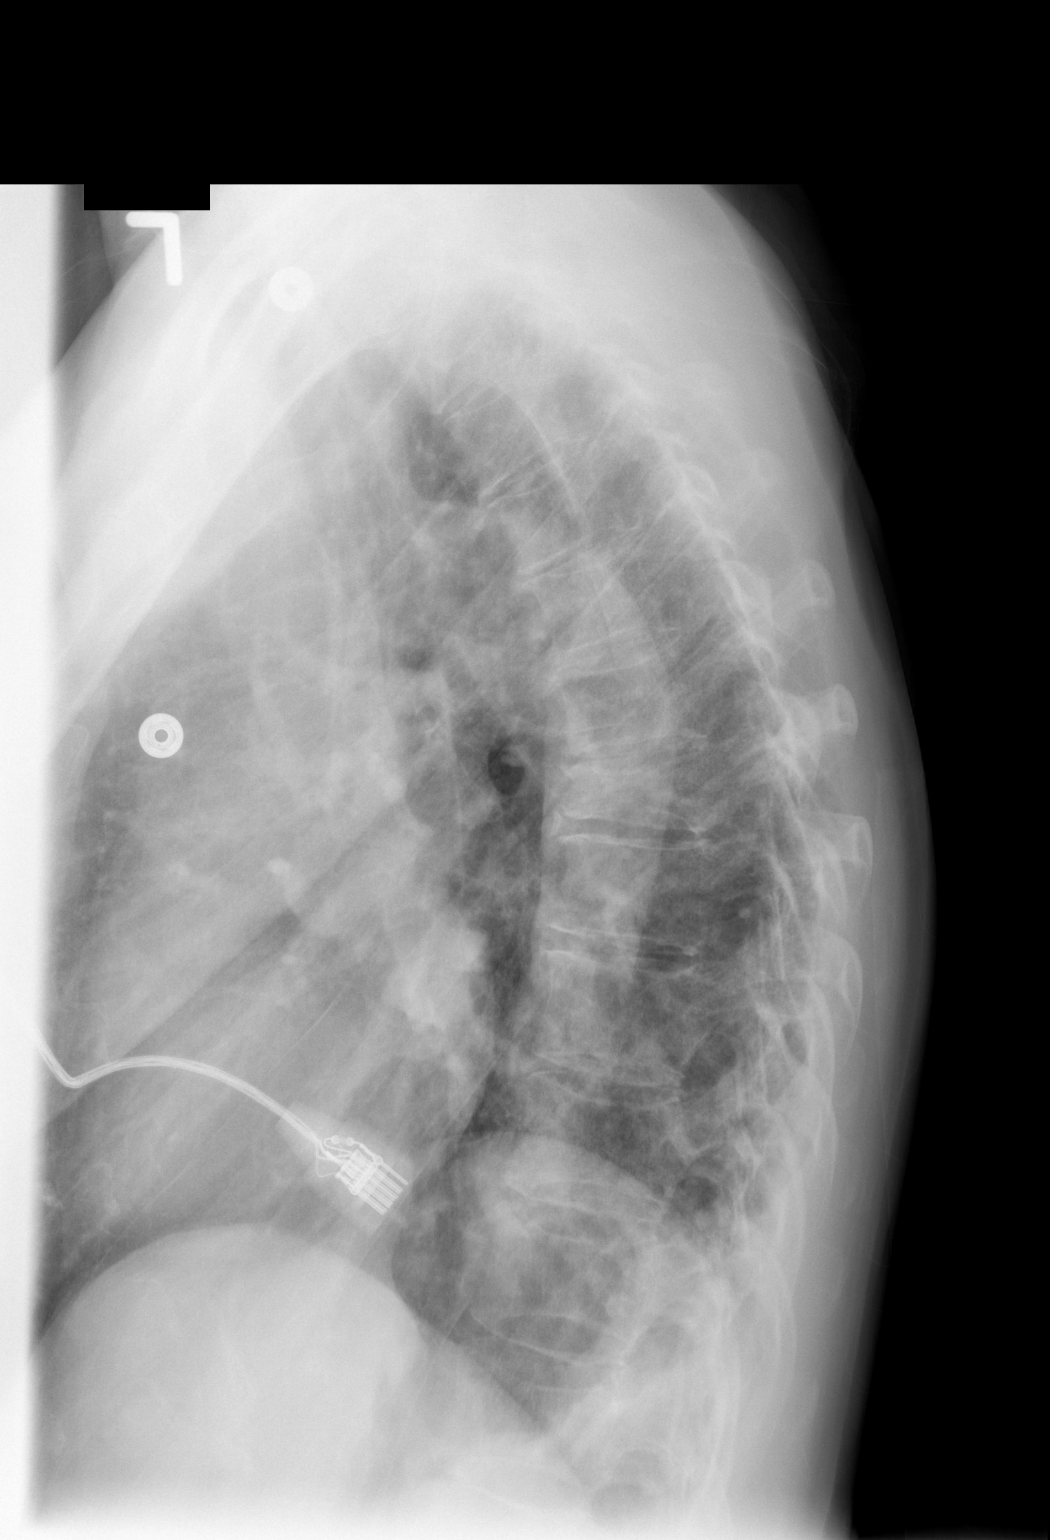

[t swimmers]
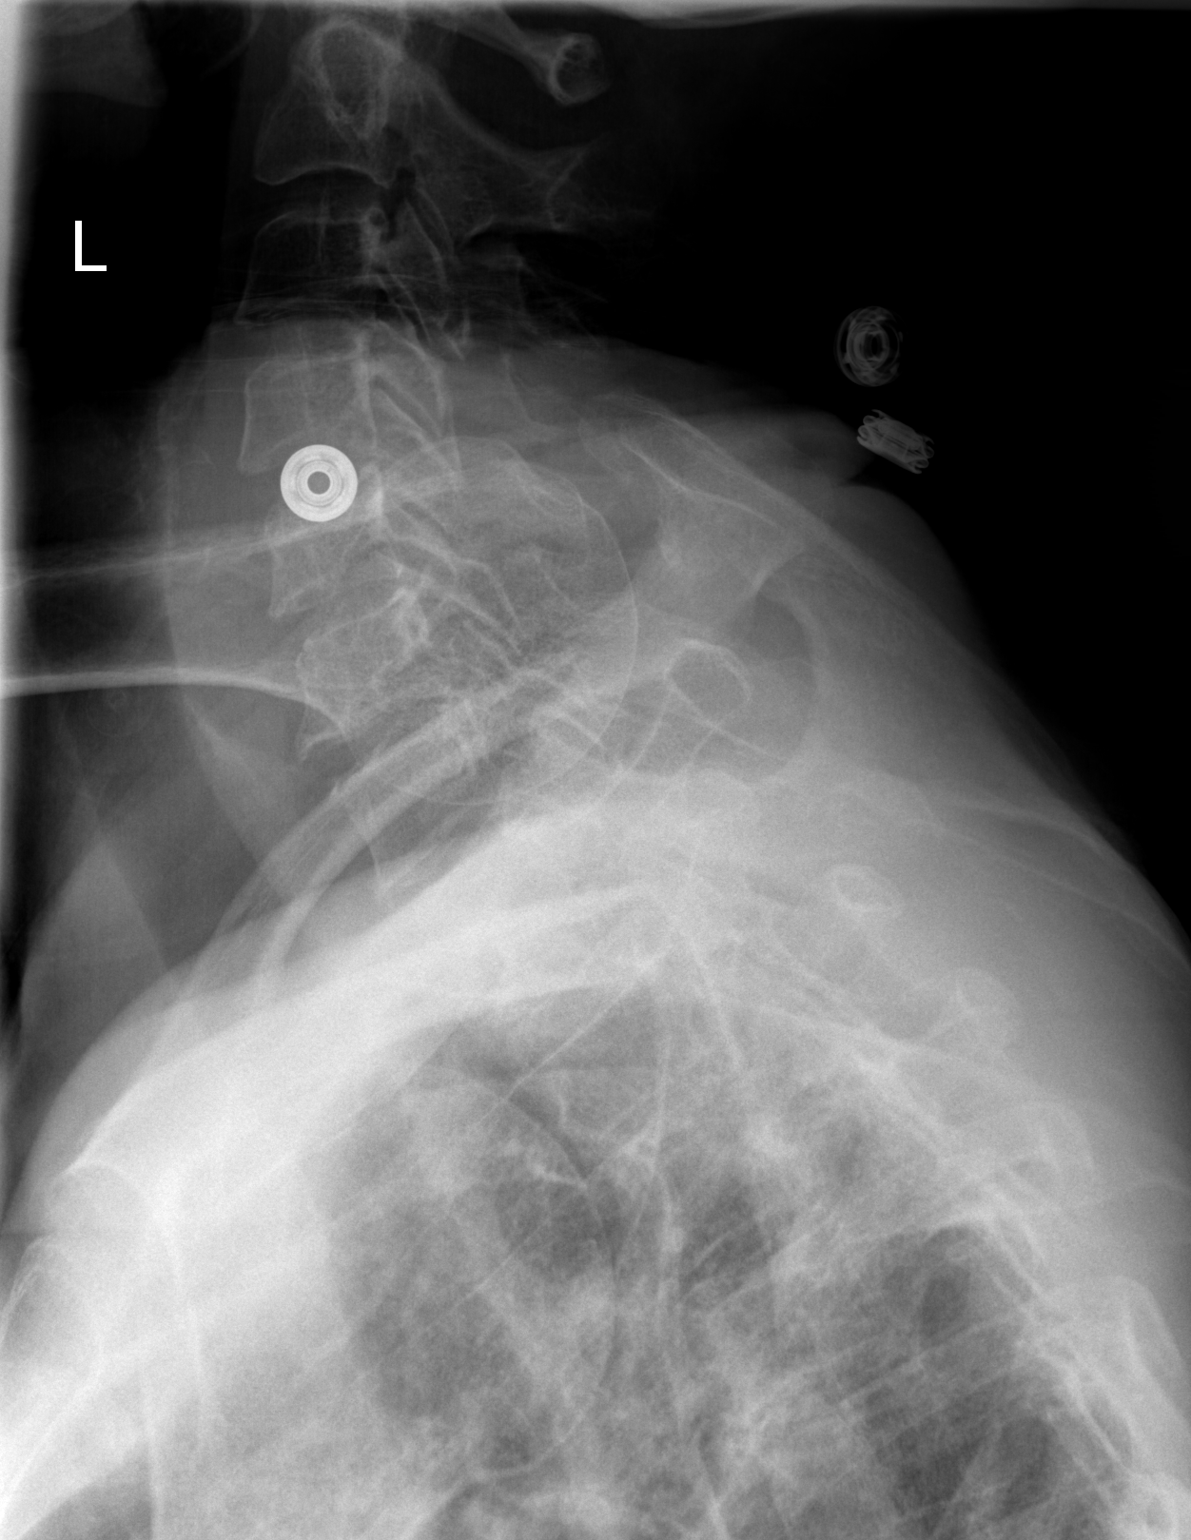

[3 of 3 positions shown; findings below may reference images not displayed]

FINDINGS: Three views of thoracic spine submitted.  Alignment and
disc spaces are preserved.  There is moderate compression deformity
mid thoracic spine of indeterminate age.  Clinical correlation is
necessary.  If recent fracture is suspected further evaluation with
CT scan or MRI is recommended.
IMPRESSION: Moderate compression deformity mid thoracic spine of indeterminate
age.  Clinical correlation is necessary.
